# Patient Record
Sex: Male | Born: 1965
Health system: Southern US, Community
[De-identification: ages and names within clinical notes are randomized; demographics above are authoritative.]

## PROBLEM LIST (undated history)

## (undated) DIAGNOSIS — F419 Anxiety disorder, unspecified: Secondary | ICD-10-CM

## (undated) DIAGNOSIS — T8859XA Other complications of anesthesia, initial encounter: Secondary | ICD-10-CM

## (undated) DIAGNOSIS — Z860100 Personal history of colon polyps, unspecified: Secondary | ICD-10-CM

## (undated) DIAGNOSIS — K648 Other hemorrhoids: Secondary | ICD-10-CM

## (undated) DIAGNOSIS — M549 Dorsalgia, unspecified: Secondary | ICD-10-CM

## (undated) DIAGNOSIS — K602 Anal fissure, unspecified: Secondary | ICD-10-CM

## (undated) DIAGNOSIS — K219 Gastro-esophageal reflux disease without esophagitis: Secondary | ICD-10-CM

## (undated) DIAGNOSIS — T4145XA Adverse effect of unspecified anesthetic, initial encounter: Secondary | ICD-10-CM

## (undated) DIAGNOSIS — E785 Hyperlipidemia, unspecified: Secondary | ICD-10-CM

## (undated) DIAGNOSIS — R1310 Dysphagia, unspecified: Secondary | ICD-10-CM

## (undated) DIAGNOSIS — A048 Other specified bacterial intestinal infections: Secondary | ICD-10-CM

## (undated) HISTORY — PX: APPENDECTOMY: SHX54

## (undated) HISTORY — DX: Other specified bacterial intestinal infections: A04.8

## (undated) HISTORY — DX: Anal fissure, unspecified: K60.2

## (undated) HISTORY — PX: COLONOSCOPY: SHX174

## (undated) HISTORY — DX: Dysphagia, unspecified: R13.10

## (undated) HISTORY — PX: UPPER GASTROINTESTINAL ENDOSCOPY: SHX188

## (undated) HISTORY — PX: POLYPECTOMY: SHX149

## (undated) HISTORY — PX: BACK SURGERY: SHX140

## (undated) HISTORY — DX: Personal history of colonic polyps: Z86.010

## (undated) HISTORY — DX: Personal history of colon polyps, unspecified: Z86.0100

## (undated) HISTORY — DX: Anxiety disorder, unspecified: F41.9

## (undated) HISTORY — PX: COLONOSCOPY: SHX5424

## (undated) HISTORY — DX: Other hemorrhoids: K64.8

## (undated) HISTORY — DX: Hyperlipidemia, unspecified: E78.5

---

## 2007-12-16 ENCOUNTER — Encounter (INDEPENDENT_AMBULATORY_CARE_PROVIDER_SITE_OTHER): Payer: Self-pay | Admitting: Surgery

## 2007-12-16 ENCOUNTER — Ambulatory Visit (HOSPITAL_COMMUNITY): Admission: RE | Admit: 2007-12-16 | Discharge: 2007-12-17 | Payer: Self-pay | Admitting: Surgery

## 2007-12-16 ENCOUNTER — Ambulatory Visit (HOSPITAL_COMMUNITY): Admission: RE | Admit: 2007-12-16 | Discharge: 2007-12-16 | Payer: Self-pay | Admitting: Family Medicine

## 2011-01-16 NOTE — H&P (Signed)
Aaron Simon, Aaron Simon                ACCOUNT NO.:  [id number]   MEDICAL RECORD NO.:  [id number]         PATIENT TYPE:  AMB   LOCATION:  DAY                          FACILITY:  [facility]   PHYSICIAN:  Currie Paris, M.D.DATE OF BIRTH:  12/10/1966   DATE OF ADMISSION:  12/16/2007  DATE OF DISCHARGE:                              HISTORY & PHYSICAL   CHIEF COMPLAINT:  Right lower quadrant pain.   HISTORY OF PRESENT ILLNESS:  Mr. Iwanicki is a 45 year old gentleman who  has had about a 2-day history of abdominal pain which is localized into  the right lower quadrant. He had a little nausea and anorexia yesterday  but really has not eaten since yesterday either. He has had no  significant nausea or vomiting. No fevers or chills, and he has not had  a bowel movement. His pain got a little bit better after he got up this  morning but was bothering him pretty much during the night. He saw Dr.  Penni Bombard and was suspected to have appendicitis and was sent to [facility] to  obtain labs and x-rays including a CT scan.   The patient is otherwise considered in general good health.   PAST MEDICAL HISTORY AND OPERATIONS:  None.   MEDICATIONS:  None.   ALLERGIES:  None known.   SOCIAL HISTORY:  Smokes none. Alcohol none. 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   FAMILY HISTORY:  His mother has had a brain tumor. Other than that  negative.   REVIEW OF SYSTEMS:  HEENT:  Negative. CHEST:  No cough or shortness of  breath. HEART:  No history of murmurs or cardiac issues. ABDOMEN:  Negative except for HPI. GU:  Negative. EXTREMITIES:  Negative.   PHYSICAL EXAMINATION:  GENERAL:  The patient is alert, awake, oriented.  Generally healthy appearing.  VITAL SIGNS:  Noted in the chart.  HEAD:  Normocephalic.  EYES:  Nonicteric. Pupils equal, round and regular.  NECK:  Supple. No masses or thyromegaly.  LUNGS:  Normal  respirations. Clear to auscultation.  HEART:  Regular rhythm. No murmurs, rubs, or gallops.  ABDOMEN:  Basically soft, but he is distinctly tender in the right lower  quadrant with some very early rebound. There is minimal guarding there.  The rest of the abdomen is completely soft and completely benign. Bowel  sounds are present.  EXTREMITIES:  Good range of motion. No edema noted. No deformities  noted.   DATA REVIEWED:  White count is only 4500. CT scan is consistent with an  early appendicitis.   IMPRESSION:  Evolving appendicitis.   RECOMMENDATIONS:  Given his CT scan, I think proceeding with  appendectomy is the appropriate plan. I have discussed the indications,  risks, and complications with the patient and his wife. I think they are  all in agreement. We will try to do this this afternoon at some point in  time.      Currie Paris, M.D.  Electronically Signed     CJS/MEDQ  D:  12/16/2007  T:  12/16/2007  Job:  [id number]

## 2011-01-16 NOTE — Op Note (Signed)
NAMEMARLYN, Simon                ACCOUNT NO.:  [id number]   MEDICAL RECORD NO.:  [id number]          PATIENT TYPE:  AMB   LOCATION:  DAY                          FACILITY:  [facility]   PHYSICIAN:  Currie Paris, M.D.DATE OF BIRTH:  Feb 11, 1966   DATE OF PROCEDURE:  12/16/2007  DATE OF DISCHARGE:                               OPERATIVE REPORT   PREOPERATIVE DIAGNOSIS:  Acute appendicitis.   POSTOPERATIVE DIAGNOSIS:  Acute appendicitis.   OPERATION:  Laparoscopic appendectomy.   SURGEON:  Currie Paris, M.D.   ANESTHESIA:  General.   CLINICAL HISTORY:  This is a 45 year old gentleman with about a 36 hour  history of abdominal pain localized into the right lower quadrant with  mild right lower quadrant tenderness and early rebound on physical  examination.  His white count was normal, but his CT scan was consistent  with an early appendicitis.  It was decided to proceed to appendectomy.   DESCRIPTION OF PROCEDURE:  The patient was seen in the holding area.  We  reviewed the plans for surgery, and he had no further questions.   He was taken to the operating room and after satisfactory general  anesthesia had been obtained, a Foley catheter was placed, and the  abdomen clipped, prepped and draped.  The time-out was done.  I used  0.25% plain Marcaine for each incision.  I made an umbilical incision,  opened the fascia, and entered the peritoneal cavity under direct  vision.  A purse-string was placed, and a Hasson introduced.  The  abdomen was insufflated to 15.  The appendix was noted almost  immediately to be turgid and pointing upright or anteriorly with edema  of the mesentery, consistent with an early appendicitis.   A 5 mm trocar is placed under direct vision in the right upper quadrant,  and another 5 mm trocar placed under direct vision in the left lower  quadrant.  The camera was placed in the left lower quadrant port.  The  appendix was grasped and elevated so  that I could see the mesentery, and  the mesentery was divided down to the base with a harmonic scalpel, and  this went well with no evidence of bleeding.  The endo-GIA was placed  through the umbilical port at the base of the appendix and closed.  I  waited 30 seconds and then fired it, disconnecting the appendix.  A few  stray staples were collected.   The appendix was placed in a bag and brought out the umbilical port.  The Hasson was reintroduced, and we did a final insufflation check for  hemostasis.  We looked at the staple wound, and everything looked okay.  There were no other gross abnormalities noted.   The two 5 mm ports were removed under direct vision.  There was no  bleeding.  The umbilical port was removed, and the abdomen deflated  through that site.  A pursestring was used to close the fascia.  The  skin was closed with 4-0 Monocryl, subcuticular, and Dermabond.   The patient tolerated the procedure well, and there  were no operative  complications.  All counts were correct.      Currie Paris, M.D.  Electronically Signed     CJS/MEDQ  D:  12/16/2007  T:  12/16/2007  Job:  [id number]   cc:   Quita Skye. Artis Flock, M.D.  Fax: [phone]

## 2012-04-07 ENCOUNTER — Other Ambulatory Visit: Payer: Self-pay | Admitting: Orthopedic Surgery

## 2012-04-08 ENCOUNTER — Encounter (HOSPITAL_COMMUNITY): Payer: Self-pay | Admitting: Pharmacy Technician

## 2012-04-08 ENCOUNTER — Other Ambulatory Visit: Payer: Self-pay | Admitting: Orthopedic Surgery

## 2012-04-14 ENCOUNTER — Encounter (HOSPITAL_COMMUNITY)
Admission: RE | Admit: 2012-04-14 | Discharge: 2012-04-14 | Disposition: A | Discharge status: Home / Self Care | Payer: BC Managed Care – PPO | Source: Ambulatory Visit | Attending: Orthopedic Surgery | Admitting: Orthopedic Surgery

## 2012-04-14 ENCOUNTER — Encounter (HOSPITAL_COMMUNITY): Payer: Self-pay

## 2012-04-14 HISTORY — DX: Dorsalgia, unspecified: M54.9

## 2012-04-14 LAB — CBC WITH DIFFERENTIAL/PLATELET
Basophils Relative: 0 % (ref 0–1)
HCT: 44.9 % (ref 39.0–52.0)
Hemoglobin: 15.8 g/dL (ref 13.0–17.0)
Lymphocytes Relative: 41 % (ref 12–46)
Monocytes Absolute: 0.5 10*3/uL (ref 0.1–1.0)
Monocytes Relative: 7 % (ref 3–12)
Neutro Abs: 3.4 10*3/uL (ref 1.7–7.7)
Neutrophils Relative %: 51 % (ref 43–77)
RBC: 5 MIL/uL (ref 4.22–5.81)
WBC: 6.7 10*3/uL (ref 4.0–10.5)

## 2012-04-14 LAB — URINALYSIS, ROUTINE W REFLEX MICROSCOPIC
Glucose, UA: NEGATIVE mg/dL
Leukocytes, UA: NEGATIVE
Nitrite: NEGATIVE
Specific Gravity, Urine: 1.015 (ref 1.005–1.030)
pH: 7 (ref 5.0–8.0)

## 2012-04-14 LAB — COMPREHENSIVE METABOLIC PANEL
AST: 24 U/L (ref 0–37)
Albumin: 4.1 g/dL (ref 3.5–5.2)
Alkaline Phosphatase: 42 U/L (ref 39–117)
BUN: 14 mg/dL (ref 6–23)
CO2: 29 mEq/L (ref 19–32)
Chloride: 102 mEq/L (ref 96–112)
Creatinine, Ser: 0.89 mg/dL (ref 0.50–1.35)
GFR calc non Af Amer: >90 mL/min (ref >90)
Potassium: 3.9 mEq/L (ref 3.5–5.1)
Total Bilirubin: 0.9 mg/dL (ref 0.3–1.2)

## 2012-04-14 LAB — PROTIME-INR: INR: 1.06 (ref 0.00–1.49)

## 2012-04-14 LAB — TYPE AND SCREEN

## 2012-04-14 LAB — APTT: aPTT: 28 seconds (ref 24–37)

## 2012-04-14 LAB — ABO/RH: ABO/RH(D): O NEG

## 2012-04-14 NOTE — Progress Notes (Signed)
Pt had EKG tracings from PCP, however no interpretation. Requested from office with interpretation - [employer] [employer] in [place].   Pt also brought results of PCR swab from 03/26/12, done at [facility], negative for staph and MRSA.

## 2012-04-14 NOTE — Pre-Procedure Instructions (Signed)
20 DEMETRIES COIA  04/14/2012    Your procedure is scheduled on:  Thursday, August 15  Report to [facility] at 8:30 AM.  Call this number if you have problems the morning of surgery: [phone]   Remember:   Do not eat food:After Midnight.  May have clear liquids:until Midnight .  Clear liquids include soda, tea, black coffee, apple or grape juice, broth.  Take these medicines the morning of surgery with A SIP OF WATER: none   Do not wear jewelry, make-up or nail polish.  Do not wear lotions, powders, or perfumes. You may wear deodorant.  Do not shave 48 hours prior to surgery. Men may shave face and neck.  Do not bring valuables to the hospital.  Contacts, dentures or bridgework may not be worn into surgery.  Leave suitcase in the car. After surgery it may be brought to your room.  For patients admitted to the hospital, checkout time is 11:00 AM the day of discharge.   Patients discharged the day of surgery will not be allowed to drive home.  Name and phone number of your driver: Sherlon Handing, wife  Special Instructions: Incentive Spirometry - Practice and bring it with you on the day of surgery. and CHG Shower Use Special Wash: 1/2 bottle night before surgery and 1/2 bottle morning of surgery.   Please read over the following fact sheets that you were given: Pain Booklet, Coughing and Deep Breathing, Blood Transfusion Information and Surgical Site Infection Prevention

## 2012-04-15 NOTE — Consult Note (Addendum)
Anesthesia Chart Review:  Patient is a 46 year old male scheduled for right sided L5-S1 microdiskectomy on 04/17/12 by Dr. Yevette Edwards.  History includes non-smoker, back pain, appendectomy.  Per PAT RN notes, he receives primary care at [facility] in [place], however; he had a pre-operative evaluation by Dr. Orvan Falconer at [facility]  (I believe his wife is a Secondary school teacher at [employer].)  He had a normal CXR on 04/14/12.  Labs on 04/14/12 are WNL.    EKG from 03/28/12 (PCP) showed NSR with low voltage QRS in aVL.  Apparently, his initial EKG on 03/28/12 from [facility] was thought to have an aflutter pattern versus artifact (sinus arrhythmia with incomplete right BBB, non-specific T wave changes), and patient was referred to [facility] in [place] and saw Dr. Josiah Lobo. He reassured the patient about previous EKG findings, but did order an echo that was done on 04/15/12 that showed preserved LV systolic function, impaired relaxation, mild MR/TR.  By Dr. Crisoforo Oxford notes, if patients echo didn't show any significant findings he was not felt to be high risk for surgery.  Anticipate he can proceed as planned.  Shonna Chock, PA-C

## 2012-04-16 MED ORDER — CEFAZOLIN SODIUM-DEXTROSE 2-3 GM-% IV SOLR
2.0000 g | INTRAVENOUS | Status: AC
  Administered 2012-04-17: 2 g via INTRAVENOUS
  Filled 2012-04-16: qty 50

## 2012-04-16 MED ORDER — CHLORHEXIDINE GLUCONATE 4 % EX LIQD: 60.0000 mL | Freq: Once | CUTANEOUS | Status: DC

## 2012-04-16 MED ORDER — POVIDONE-IODINE 7.5 % EX SOLN
Freq: Once | CUTANEOUS | Status: DC
  Filled 2012-04-16: qty 118

## 2012-04-17 ENCOUNTER — Ambulatory Visit (HOSPITAL_COMMUNITY)
Admission: RE | Admit: 2012-04-17 | Discharge: 2012-04-17 | Disposition: A | Discharge status: Home / Self Care | Payer: BC Managed Care – PPO | Source: Ambulatory Visit | Attending: Orthopedic Surgery | Admitting: Orthopedic Surgery

## 2012-04-17 ENCOUNTER — Ambulatory Visit (HOSPITAL_COMMUNITY): Payer: BC Managed Care – PPO | Admitting: Vascular Surgery

## 2012-04-17 ENCOUNTER — Encounter (HOSPITAL_COMMUNITY): Payer: Self-pay | Admitting: Vascular Surgery

## 2012-04-17 ENCOUNTER — Encounter (HOSPITAL_COMMUNITY)
Admission: RE | Disposition: A | Discharge status: Home / Self Care | Payer: Self-pay | Source: Ambulatory Visit | Attending: Orthopedic Surgery

## 2012-04-17 ENCOUNTER — Ambulatory Visit (HOSPITAL_COMMUNITY): Payer: BC Managed Care – PPO

## 2012-04-17 DIAGNOSIS — M5416 Radiculopathy, lumbar region: Secondary | ICD-10-CM

## 2012-04-17 DIAGNOSIS — Z01811 Encounter for preprocedural respiratory examination: Secondary | ICD-10-CM | POA: Insufficient documentation

## 2012-04-17 DIAGNOSIS — Z01818 Encounter for other preprocedural examination: Secondary | ICD-10-CM | POA: Insufficient documentation

## 2012-04-17 DIAGNOSIS — Z01812 Encounter for preprocedural laboratory examination: Secondary | ICD-10-CM | POA: Insufficient documentation

## 2012-04-17 DIAGNOSIS — M5126 Other intervertebral disc displacement, lumbar region: Secondary | ICD-10-CM | POA: Insufficient documentation

## 2012-04-17 HISTORY — PX: LUMBAR LAMINECTOMY/DECOMPRESSION MICRODISCECTOMY: SHX5026

## 2012-04-17 SURGERY — LUMBAR LAMINECTOMY/DECOMPRESSION MICRODISCECTOMY
Anesthesia: General | Site: Back | Laterality: Right | Wound class: Clean

## 2012-04-17 MED ORDER — HEMOSTATIC AGENTS (NO CHARGE) OPTIME
TOPICAL | Status: DC | PRN
  Administered 2012-04-17: 1 via TOPICAL

## 2012-04-17 MED ORDER — BUPIVACAINE-EPINEPHRINE PF 0.25-1:200000 % IJ SOLN
INTRAMUSCULAR | Status: AC
  Filled 2012-04-17: qty 30

## 2012-04-17 MED ORDER — ROCURONIUM BROMIDE 100 MG/10ML IV SOLN
INTRAVENOUS | Status: DC | PRN
  Administered 2012-04-17: 50 mg via INTRAVENOUS

## 2012-04-17 MED ORDER — MIDAZOLAM HCL 5 MG/5ML IJ SOLN
INTRAMUSCULAR | Status: DC | PRN
  Administered 2012-04-17 (×2): 1 mg via INTRAVENOUS

## 2012-04-17 MED ORDER — THROMBIN 20000 UNITS EX SOLR
CUTANEOUS | Status: DC | PRN
  Administered 2012-04-17: 11:00:00 via TOPICAL

## 2012-04-17 MED ORDER — PROPOFOL 10 MG/ML IV EMUL
INTRAVENOUS | Status: DC | PRN
  Administered 2012-04-17: 160 mg via INTRAVENOUS

## 2012-04-17 MED ORDER — GLYCOPYRROLATE 0.2 MG/ML IJ SOLN
INTRAMUSCULAR | Status: DC | PRN
  Administered 2012-04-17: .5 mg via INTRAVENOUS

## 2012-04-17 MED ORDER — METHYLPREDNISOLONE ACETATE 40 MG/ML IJ SUSP
INTRAMUSCULAR | Status: DC | PRN
  Administered 2012-04-17: .5 mL

## 2012-04-17 MED ORDER — LIDOCAINE HCL 4 % MT SOLN
OROMUCOSAL | Status: DC | PRN
  Administered 2012-04-17: 4 mL via TOPICAL

## 2012-04-17 MED ORDER — BUPIVACAINE-EPINEPHRINE 0.25% -1:200000 IJ SOLN
INTRAMUSCULAR | Status: DC | PRN
  Administered 2012-04-17: 1 mL

## 2012-04-17 MED ORDER — LIDOCAINE HCL (CARDIAC) 20 MG/ML IV SOLN
INTRAVENOUS | Status: DC | PRN
  Administered 2012-04-17: 100 mg via INTRAVENOUS

## 2012-04-17 MED ORDER — PROMETHAZINE HCL 25 MG/ML IJ SOLN: 6.2500 mg | INTRAMUSCULAR | Status: DC | PRN

## 2012-04-17 MED ORDER — FENTANYL CITRATE 0.05 MG/ML IJ SOLN: 50.0000 ug | INTRAMUSCULAR | Status: DC | PRN

## 2012-04-17 MED ORDER — INDIGOTINDISULFONATE SODIUM 8 MG/ML IJ SOLN
INTRAMUSCULAR | Status: DC | PRN
  Administered 2012-04-17: .3 mL

## 2012-04-17 MED ORDER — NEOSTIGMINE METHYLSULFATE 1 MG/ML IJ SOLN
INTRAMUSCULAR | Status: DC | PRN
  Administered 2012-04-17: 3 mg via INTRAVENOUS

## 2012-04-17 MED ORDER — THROMBIN 20000 UNITS EX SOLR
CUTANEOUS | Status: AC
  Filled 2012-04-17: qty 20000

## 2012-04-17 MED ORDER — HYDROMORPHONE HCL PF 1 MG/ML IJ SOLN
0.2500 mg | INTRAMUSCULAR | Status: DC | PRN
  Administered 2012-04-17: 0.5 mg via INTRAVENOUS

## 2012-04-17 MED ORDER — FENTANYL CITRATE 0.05 MG/ML IJ SOLN
INTRAMUSCULAR | Status: DC | PRN
  Administered 2012-04-17: 50 ug via INTRAVENOUS
  Administered 2012-04-17: 100 ug via INTRAVENOUS

## 2012-04-17 MED ORDER — HYDROMORPHONE HCL PF 1 MG/ML IJ SOLN
INTRAMUSCULAR | Status: AC
  Filled 2012-04-17: qty 1

## 2012-04-17 MED ORDER — LACTATED RINGERS IV SOLN
INTRAVENOUS | Status: DC
  Administered 2012-04-17: 10:00:00 via INTRAVENOUS

## 2012-04-17 MED ORDER — ONDANSETRON HCL 4 MG/2ML IJ SOLN
INTRAMUSCULAR | Status: DC | PRN
  Administered 2012-04-17: 4 mg via INTRAVENOUS

## 2012-04-17 MED ORDER — METHYLPREDNISOLONE ACETATE 80 MG/ML IJ SUSP
INTRAMUSCULAR | Status: AC
  Filled 2012-04-17: qty 1

## 2012-04-17 MED ORDER — LACTATED RINGERS IV SOLN
INTRAVENOUS | Status: DC | PRN
  Administered 2012-04-17 (×2): via INTRAVENOUS

## 2012-04-17 MED ORDER — INDIGOTINDISULFONATE SODIUM 8 MG/ML IJ SOLN
INTRAMUSCULAR | Status: AC
  Filled 2012-04-17: qty 5

## 2012-04-17 MED ORDER — MIDAZOLAM HCL 2 MG/2ML IJ SOLN: 1.0000 mg | INTRAMUSCULAR | Status: DC | PRN

## 2012-04-17 SURGICAL SUPPLY — 60 items
BENZOIN TINCTURE PRP APPL 2/3 (GAUZE/BANDAGES/DRESSINGS) ×4 IMPLANT
BUR ROUND PRECISION 4.0 (BURR) ×2 IMPLANT
CANISTER SUCTION 2500CC (MISCELLANEOUS) ×2 IMPLANT
CLOTH BEACON ORANGE TIMEOUT ST (SAFETY) ×2 IMPLANT
CLSR STERI-STRIP ANTIMIC 1/2X4 (GAUZE/BANDAGES/DRESSINGS) ×2 IMPLANT
CONT SPEC STER OR (MISCELLANEOUS) ×2 IMPLANT
CORDS BIPOLAR (ELECTRODE) ×2 IMPLANT
COVER SURGICAL LIGHT HANDLE (MISCELLANEOUS) ×2 IMPLANT
DRAIN CHANNEL 10F 3/8 F FF (DRAIN) IMPLANT
DRAPE POUCH INSTRU U-SHP 10X18 (DRAPES) ×4 IMPLANT
DRAPE SURG 17X23 STRL (DRAPES) ×8 IMPLANT
DURAPREP 26ML APPLICATOR (WOUND CARE) ×2 IMPLANT
ELECT BLADE 4.0 EZ CLEAN MEGAD (MISCELLANEOUS)
ELECT BLADE 6.5 EXT (BLADE) IMPLANT
ELECT CAUTERY BLADE 6.4 (BLADE) ×2 IMPLANT
ELECT REM PT RETURN 9FT ADLT (ELECTROSURGICAL) ×2
ELECTRODE BLDE 4.0 EZ CLN MEGD (MISCELLANEOUS) IMPLANT
ELECTRODE REM PT RTRN 9FT ADLT (ELECTROSURGICAL) ×1 IMPLANT
EVACUATOR SILICONE 100CC (DRAIN) IMPLANT
FILTER STRAW FLUID ASPIR (MISCELLANEOUS) ×2 IMPLANT
GAUZE SPONGE 4X4 16PLY XRAY LF (GAUZE/BANDAGES/DRESSINGS) ×4 IMPLANT
GLOVE BIO SURGEON STRL SZ8 (GLOVE) ×2 IMPLANT
GLOVE BIOGEL PI IND STRL 8 (GLOVE) ×1 IMPLANT
GLOVE BIOGEL PI INDICATOR 8 (GLOVE) ×1
GOWN BRE IMP SLV AUR XL STRL (GOWN DISPOSABLE) ×2 IMPLANT
GOWN STRL NON-REIN LRG LVL3 (GOWN DISPOSABLE) IMPLANT
GOWN STRL REIN XL XLG (GOWN DISPOSABLE) ×6 IMPLANT
IV CATH 14GX2 1/4 (CATHETERS) ×2 IMPLANT
KIT BASIN OR (CUSTOM PROCEDURE TRAY) ×2 IMPLANT
KIT ROOM TURNOVER OR (KITS) ×2 IMPLANT
NEEDLE 18GX1X1/2 (RX/OR ONLY) (NEEDLE) ×2 IMPLANT
NEEDLE HYPO 25GX1X1/2 BEV (NEEDLE) ×2 IMPLANT
NEEDLE SPNL 18GX3.5 QUINCKE PK (NEEDLE) ×4 IMPLANT
NS IRRIG 1000ML POUR BTL (IV SOLUTION) ×2 IMPLANT
PACK LAMINECTOMY ORTHO (CUSTOM PROCEDURE TRAY) ×2 IMPLANT
PACK UNIVERSAL I (CUSTOM PROCEDURE TRAY) ×2 IMPLANT
PAD ARMBOARD 7.5X6 YLW CONV (MISCELLANEOUS) ×4 IMPLANT
PATTIES SURGICAL .5 X.5 (GAUZE/BANDAGES/DRESSINGS) ×2 IMPLANT
PATTIES SURGICAL .5 X1 (DISPOSABLE) ×2 IMPLANT
PATTIES SURGICAL 1X1 (DISPOSABLE) IMPLANT
SPONGE GAUZE 4X4 12PLY (GAUZE/BANDAGES/DRESSINGS) ×2 IMPLANT
STRIP CLOSURE SKIN 1/2X4 (GAUZE/BANDAGES/DRESSINGS) ×2 IMPLANT
SURGIFLO TRUKIT (HEMOSTASIS) IMPLANT
SUT ETHILON 3 0 FSL (SUTURE) IMPLANT
SUT VIC AB 0 CT1 27 (SUTURE)
SUT VIC AB 0 CT1 27XBRD ANBCTR (SUTURE) IMPLANT
SUT VIC AB 0 CT2 27 (SUTURE) ×2 IMPLANT
SUT VIC AB 1 CT1 18XCR BRD 8 (SUTURE) ×1 IMPLANT
SUT VIC AB 1 CT1 8-18 (SUTURE) ×1
SUT VIC AB 2-0 CT2 18 VCP726D (SUTURE) ×2 IMPLANT
SYR 20CC LL (SYRINGE) IMPLANT
SYR BULB IRRIGATION 50ML (SYRINGE) ×2 IMPLANT
SYR CONTROL 10ML LL (SYRINGE) ×2 IMPLANT
SYR TB 1ML 26GX3/8 SAFETY (SYRINGE) ×4 IMPLANT
SYR TB 1ML LUER SLIP (SYRINGE) ×4 IMPLANT
TAPE CLOTH SURG 4X10 WHT LF (GAUZE/BANDAGES/DRESSINGS) ×2 IMPLANT
TOWEL OR 17X24 6PK STRL BLUE (TOWEL DISPOSABLE) ×2 IMPLANT
TOWEL OR 17X26 10 PK STRL BLUE (TOWEL DISPOSABLE) ×2 IMPLANT
WATER STERILE IRR 1000ML POUR (IV SOLUTION) ×2 IMPLANT
YANKAUER SUCT BULB TIP NO VENT (SUCTIONS) ×2 IMPLANT

## 2012-04-17 NOTE — Transfer of Care (Signed)
Immediate Anesthesia Transfer of Care Note  Patient: Aaron Simon  Procedure(s) Performed: Procedure(s) (LRB): LUMBAR LAMINECTOMY/DECOMPRESSION MICRODISCECTOMY (Right)  Patient Location: PACU  Anesthesia Type: General  Level of Consciousness: awake, alert  and oriented  Airway & Oxygen Therapy: Patient Spontanous Breathing and Patient connected to nasal cannula oxygen  Post-op Assessment: Report given to PACU RN, Post -op Vital signs reviewed and stable and Patient moving all extremities X 4  Post vital signs: Reviewed and stable  Complications: No apparent anesthesia complications

## 2012-04-17 NOTE — Preoperative (Signed)
Beta Blockers   Reason not to administer Beta Blockers:Not Applicable 

## 2012-04-17 NOTE — Anesthesia Preprocedure Evaluation (Signed)
Anesthesia Evaluation  Patient identified by MRN, date of birth, ID band Patient awake    Reviewed: Allergy & Precautions, H&P , NPO status , Patient's Chart, lab work & pertinent test results  Airway Mallampati: I TM Distance: >3 FB Neck ROM: Full    Dental   Pulmonary    Pulmonary exam normal       Cardiovascular     Neuro/Psych    GI/Hepatic   Endo/Other    Renal/GU      Musculoskeletal   Abdominal   Peds  Hematology   Anesthesia Other Findings   Reproductive/Obstetrics                           Anesthesia Physical Anesthesia Plan  ASA: I  Anesthesia Plan: General   Post-op Pain Management:    Induction: Intravenous  Airway Management Planned: Oral ETT  Additional Equipment:   Intra-op Plan:   Post-operative Plan: Extubation in OR  Informed Consent: I have reviewed the patients History and Physical, chart, labs and discussed the procedure including the risks, benefits and alternatives for the proposed anesthesia with the patient or authorized representative who has indicated his/her understanding and acceptance.     Plan Discussed with: CRNA and Surgeon  Anesthesia Plan Comments:         Anesthesia Quick Evaluation

## 2012-04-17 NOTE — Progress Notes (Signed)
Report given to phillip rn as caregiver 

## 2012-04-17 NOTE — Anesthesia Procedure Notes (Signed)
Procedure Name: Intubation Date/Time: 04/17/2012 10:25 AM Performed by: Margaree Mackintosh Pre-anesthesia Checklist: Patient identified, Timeout performed, Emergency Drugs available, Suction available and Patient being monitored Patient Re-evaluated:Patient Re-evaluated prior to inductionOxygen Delivery Method: Circle system utilized Preoxygenation: Pre-oxygenation with 100% oxygen Intubation Type: IV induction Ventilation: Mask ventilation without difficulty Laryngoscope Size: Mac and 3 Grade View: Grade I Tube type: Oral Tube size: 8.0 mm Number of attempts: 1 Airway Equipment and Method: Stylet and LTA kit utilized Placement Confirmation: ETT inserted through vocal cords under direct vision,  positive ETCO2 and breath sounds checked- equal and bilateral Secured at: 21 cm Tube secured with: Tape Dental Injury: Teeth and Oropharynx as per pre-operative assessment

## 2012-04-17 NOTE — Anesthesia Postprocedure Evaluation (Signed)
  Anesthesia Post-op Note  Patient: Aaron Simon  Procedure(s) Performed: Procedure(s) (LRB): LUMBAR LAMINECTOMY/DECOMPRESSION MICRODISCECTOMY (Right)  Patient Location: PACU  Anesthesia Type: General  Level of Consciousness: awake  Airway and Oxygen Therapy: Patient Spontanous Breathing  Post-op Pain: mild  Post-op Assessment: Post-op Vital signs reviewed, Patient's Cardiovascular Status Stable, Respiratory Function Stable, Patent Airway, No signs of Nausea or vomiting and Pain level controlled  Post-op Vital Signs: stable  Complications: No apparent anesthesia complications

## 2012-04-17 NOTE — H&P (Signed)
PREOPERATIVE H&P  Chief Complaint: right leg pain  HPI: LESS WOOLSEY is a 46 y.o. male who presents with right leg pain  Past Medical History  Diagnosis Date  . Back pain    Past Surgical History  Procedure Date  . Appendectomy    History   Social History  . Marital Status: Married    Spouse Name: N/A    Number of Children: N/A  . Years of Education: N/A   Social History Main Topics  . Smoking status: Never Smoker   . Smokeless tobacco: Not on file  . Alcohol Use: No  . Drug Use: No  . Sexually Active:    Other Topics Concern  . Not on file   Social History Narrative  . No narrative on file   No family history on file. No Known Allergies Prior to Admission medications   Medication Sig Start Date End Date Taking? Authorizing Provider  Naproxen-Esomeprazole (VIMOVO) 500-20 MG TBEC Take 1 tablet by mouth 2 times daily at 12 noon and 4 pm. Back pain On hold since April 06, 2012 until after procedure    Historical Provider, MD     All other systems have been reviewed and were otherwise negative with the exception of those mentioned in the HPI and as above.  Physical Exam: There were no vitals filed for this visit.  General: Alert, no acute distress Cardiovascular: No pedal edema Respiratory: No cyanosis, no use of accessory musculature Skin: No lesions in the area of chief complaint Neurologic: Sensation intact distally Psychiatric: Patient is competent for consent with normal mood and affect Lymphatic: No axillary or cervical lymphadenopathy    Assessment/Plan: Right leg pain Plan for Procedure(s): LUMBAR LAMINECTOMY/DECOMPRESSION MICRODISCECTOMY   Emilee Hero, MD 04/17/2012 6:53 AM

## 2012-04-18 ENCOUNTER — Encounter (HOSPITAL_COMMUNITY): Payer: Self-pay | Admitting: Orthopedic Surgery

## 2012-04-18 NOTE — Op Note (Signed)
Aaron Simon, Aaron Simon                ACCOUNT NO.:  [id number]  MEDICAL RECORD NO.:  [id number]  LOCATION:  [facility]                         FACILITY:  [facility]  PHYSICIAN:  Estill Bamberg, MD      DATE OF BIRTH:  Sep 01, 1966  DATE OF PROCEDURE:  04/17/2012 DATE OF DISCHARGE:  04/17/2012                              OPERATIVE REPORT   PREOPERATIVE DIAGNOSIS: 1. Right-sided S1 radiculopathy. 2. 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  POSTOPERATIVE DIAGNOSIS: 1. Right-sided S1 radiculopathy. 2. 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  SURGEON:  Estill Bamberg, MD  ASSISTANT:  Janace Litten, OPA  ANESTHESIA:  General endotracheal anesthesia.  COMPLICATIONS:  None disposition Stable.  ESTIMATED BLOOD LOSS:  Minimal.  INDICATIONS FOR PROCEDURE:  Briefly, Aaron Simon is an extremely pleasant 46 year old male, who presented to me with severe pain in the right leg on April 05, 2012.  At that point in time, the patient had been diagnosed with right-sided S1 radiculopathy and did have extensive forms of conservative care, including both oral and injectable steroids as well as various medications.  He continued to have some severe debilitating pain in the right leg.  An MRI was notable for right-sided L5-S1 disk herniation.  Given his failure of conservative measures, the patient and myself did have a discussion regarding going forward with a right-sided L5-S1 microdiskectomy.  The patient fully understood the risks and limitations of the procedure as outlined in my preoperative note.  OPERATIVE DETAILS:  On April 17, 2012, the patient was brought to surgery and general endotracheal anesthesia was administered.  The patient was placed prone on a well-padded a flat Jackson bed with a Wilson frame.  Antibiotics were given and a time-out procedure was performed.  I then placed two 18-gauge spinal needles over approximately the L5-S1 interspace and a  lateral intraoperative radiograph did confirm the and did help me optimize location of my incision.  I then made a 1.5 cm incision over the midline overlying the L5-S1 interspace.  The fascia was incised in a curvilinear fashion and a self-retaining McCullough retractor was placed.  I then identified the lamina L5 and S1.  The ligamentum flavum was identified.  An additional intraoperative radiograph was obtained to confirm the appropriate operative level.  I then used a 4-mm high-speed bur to remove the medial and the inferior aspect of the L5 lamina.  I then took down the ligamentum flavum and a partial facetectomy was performed.  The traversing S1 nerve was readily identified.  The traversing S1 nerve was gently mobilized medially.  An obvious disk herniation was readily apparent anterior to the traversing S1 nerve.  I did use a Penfield 4 to sweep away the superficial layers overlying the disk herniation and one very large fragment to readily to clear itself.  This was removed using a pituitary.  I then continued to explore the epidural space.  There no additional disk protrusion encountered.  There was no compression of the exiting S1 nerve, I assume and the traversing S1 nerve at this point.  I then controlled all epidural bleeding using bipolar electrocautery in addition to surgery flow.  I then  copiously irrigated the wound with approximately 200 mL of normal saline.  I then infiltrated 40 mg of Depo-Medrol about the traversing S1 nerve.  The fascia was then closed using #1 Vicryl.  The subcutaneous layer was then closed using 2-0 Vicryl.  The skin was closed using 4-0 Monocryl.  All instrument counts were correct at the termination of the procedure.  Of note, Janace Litten was my assistant throughout the entirety of the surgery and aided in essential retraction and suctioning required throughout the surgery.     Estill Bamberg, MD     MD/MEDQ  D:  04/17/2012  T:   04/18/2012  Job:  [id number]  cc:   Junious Dresser, MD

## 2014-10-27 ENCOUNTER — Other Ambulatory Visit: Payer: Self-pay | Admitting: Orthopedic Surgery

## 2014-10-29 ENCOUNTER — Other Ambulatory Visit: Payer: Self-pay | Admitting: Orthopedic Surgery

## 2014-11-02 ENCOUNTER — Other Ambulatory Visit (HOSPITAL_COMMUNITY): Payer: Self-pay

## 2014-11-10 ENCOUNTER — Other Ambulatory Visit: Payer: Self-pay | Admitting: Orthopedic Surgery

## 2014-11-22 NOTE — Pre-Procedure Instructions (Signed)
Aaron Simon  11/22/2014   Your procedure is scheduled on: Thursday, March 31.  Report to [facility] Admitting at 5:30 AM   Call this number if you have problems the morning of surgery: [phone]                For any other questions, please call [phone], Monday - Friday 8 AM - 4 PM.   Remember:   Do not eat food or drink liquids after midnight Wednesday, March 30.   Take these medicines the morning of surgery with A SIP OF WATER: dexlansoprazole (DEXILANT).               May take traMADol Veatrice Bourbon) if needed.   Do not wear jewelry, make-up or nail polish.  Do not wear lotions, powders, or perfumes.   Men may shave face and neck.  Do not bring valuables to the hospital.               [facility] is not responsible  for any belongings or valuables.               Contacts, dentures or bridgework may not be worn into surgery.  Leave suitcase in the car. After surgery it may be brought to your room.  For patients admitted to the hospital, discharge time is determined by your treatment team.             Special Instructions: Review  [place] - Preparing For Surgery.   Please read over the following fact sheets that you were given: Pain Booklet, Coughing and Deep Breathing, Blood Transfusion Information and Surgical Site Infection Prevention and Incentive Spirometry

## 2014-11-23 ENCOUNTER — Encounter (HOSPITAL_COMMUNITY)
Admission: RE | Admit: 2014-11-23 | Discharge: 2014-11-23 | Disposition: A | Discharge status: Home / Self Care | Payer: BLUE CROSS/BLUE SHIELD | Source: Ambulatory Visit | Attending: Orthopedic Surgery | Admitting: Orthopedic Surgery

## 2014-11-23 ENCOUNTER — Ambulatory Visit (HOSPITAL_COMMUNITY)
Admission: RE | Admit: 2014-11-23 | Discharge: 2014-11-23 | Disposition: A | Discharge status: Home / Self Care | Payer: BLUE CROSS/BLUE SHIELD | Source: Ambulatory Visit | Attending: Orthopedic Surgery | Admitting: Orthopedic Surgery

## 2014-11-23 ENCOUNTER — Encounter (HOSPITAL_COMMUNITY): Payer: Self-pay

## 2014-11-23 DIAGNOSIS — Z01818 Encounter for other preprocedural examination: Secondary | ICD-10-CM | POA: Insufficient documentation

## 2014-11-23 DIAGNOSIS — M545 Low back pain: Secondary | ICD-10-CM | POA: Diagnosis not present

## 2014-11-23 HISTORY — DX: Other complications of anesthesia, initial encounter: T88.59XA

## 2014-11-23 HISTORY — DX: Adverse effect of unspecified anesthetic, initial encounter: T41.45XA

## 2014-11-23 HISTORY — DX: Gastro-esophageal reflux disease without esophagitis: K21.9

## 2014-11-23 LAB — COMPREHENSIVE METABOLIC PANEL
ALT: 38 U/L (ref 0–53)
AST: 26 U/L (ref 0–37)
Albumin: 4.1 g/dL (ref 3.5–5.2)
Alkaline Phosphatase: 47 U/L (ref 39–117)
Anion gap: 11 (ref 5–15)
BUN: 6 mg/dL (ref 6–23)
CHLORIDE: 101 mmol/L (ref 96–112)
CO2: 27 mmol/L (ref 19–32)
CREATININE: 1 mg/dL (ref 0.50–1.35)
Calcium: 9.7 mg/dL (ref 8.4–10.5)
GFR calc Af Amer: >90 mL/min (ref >90)
GFR, EST NON AFRICAN AMERICAN: 87 mL/min — AB (ref >90)
Glucose, Bld: 110 mg/dL — ABNORMAL HIGH (ref 70–99)
Potassium: 3.7 mmol/L (ref 3.5–5.1)
Sodium: 139 mmol/L (ref 135–145)
Total Bilirubin: 1 mg/dL (ref 0.3–1.2)
Total Protein: 6.7 g/dL (ref 6.0–8.3)

## 2014-11-23 LAB — CBC WITH DIFFERENTIAL/PLATELET
BASOS PCT: 0 % (ref 0–1)
Basophils Absolute: 0 10*3/uL (ref 0.0–0.1)
EOS ABS: 0 10*3/uL (ref 0.0–0.7)
EOS PCT: 1 % (ref 0–5)
HEMATOCRIT: 45.4 % (ref 39.0–52.0)
Hemoglobin: 15.9 g/dL (ref 13.0–17.0)
LYMPHS ABS: 2 10*3/uL (ref 0.7–4.0)
Lymphocytes Relative: 34 % (ref 12–46)
MCH: 31.5 pg (ref 26.0–34.0)
MCHC: 35 g/dL (ref 30.0–36.0)
MCV: 90.1 fL (ref 78.0–100.0)
MONOS PCT: 8 % (ref 3–12)
Monocytes Absolute: 0.5 10*3/uL (ref 0.1–1.0)
Neutro Abs: 3.3 10*3/uL (ref 1.7–7.7)
Neutrophils Relative %: 57 % (ref 43–77)
Platelets: 279 10*3/uL (ref 150–400)
RBC: 5.04 MIL/uL (ref 4.22–5.81)
RDW: 13.2 % (ref 11.5–15.5)
WBC: 5.8 10*3/uL (ref 4.0–10.5)

## 2014-11-23 LAB — TYPE AND SCREEN
ABO/RH(D): O NEG
ANTIBODY SCREEN: NEGATIVE

## 2014-11-23 LAB — URINALYSIS, ROUTINE W REFLEX MICROSCOPIC
Bilirubin Urine: NEGATIVE
GLUCOSE, UA: NEGATIVE mg/dL
HGB URINE DIPSTICK: NEGATIVE
Ketones, ur: NEGATIVE mg/dL
Leukocytes, UA: NEGATIVE
Nitrite: NEGATIVE
Protein, ur: NEGATIVE mg/dL
Specific Gravity, Urine: 1.006 (ref 1.005–1.030)
Urobilinogen, UA: 0.2 mg/dL (ref 0.0–1.0)
pH: 6.5 (ref 5.0–8.0)

## 2014-11-23 LAB — SURGICAL PCR SCREEN
MRSA, PCR: NEGATIVE
STAPHYLOCOCCUS AUREUS: NEGATIVE

## 2014-11-23 LAB — APTT: aPTT: 30 seconds (ref 24–37)

## 2014-11-23 LAB — PROTIME-INR
INR: 0.97 (ref 0.00–1.49)
Prothrombin Time: 13 seconds (ref 11.6–15.2)

## 2014-12-01 MED ORDER — CEFAZOLIN SODIUM-DEXTROSE 2-3 GM-% IV SOLR
2.0000 g | INTRAVENOUS | Status: AC
  Administered 2014-12-02 (×2): 2 g via INTRAVENOUS
  Filled 2014-12-01: qty 50

## 2014-12-01 MED ORDER — POVIDONE-IODINE 7.5 % EX SOLN
Freq: Once | CUTANEOUS | Status: DC
  Filled 2014-12-01: qty 118

## 2014-12-01 NOTE — H&P (Signed)
     PREOPERATIVE H&P  Chief Complaint: right leg pain  HPI: Aaron Simon is a 49 y.o. male who presents with ongoing pain in the right leg. Patient is s/p a previous L5/S1 decompression 2.5 years ago, and recently had a recurrence of left leg pain  MRI reveals a large L5/S1 HNP, compression the left S1 nerve  Patient has failed multiple forms of conservative care and continues to have pain (see office notes for additional details regarding the patient's full course of treatment)  Past Medical History  Diagnosis Date  . Back pain   . Complication of anesthesia     Mental "goofy".  Sats dropped in PACU. "Does not take much"  . GERD (gastroesophageal reflux disease)    Past Surgical History  Procedure Laterality Date  . Appendectomy    . Lumbar laminectomy/decompression microdiscectomy  04/17/2012    Procedure: LUMBAR LAMINECTOMY/DECOMPRESSION MICRODISCECTOMY;  Surgeon: Sinclair Ship, MD;  Location: [facility];  Service: Orthopedics;  Laterality: Right;  Right sided lumbar 5-sacrum 1 microdisectomy  . Back surgery    . Colonoscopy    . Upper gastrointestinal endoscopy     History   Social History  . Marital Status: Married    Spouse Name: N/A  . Number of Children: N/A  . Years of Education: N/A   Social History Main Topics  . Smoking status: Never Smoker   . Smokeless tobacco: Not on file  . Alcohol Use: No  . Drug Use: No  . Sexual Activity: Not on file   Other Topics Concern  . Not on file   Social History Narrative   No family history on file. No Known Allergies Prior to Admission medications   Medication Sig Start Date End Date Taking? Authorizing Provider  dexlansoprazole (DEXILANT) 60 MG capsule Take 60 mg by mouth daily.   Yes Historical Provider, MD  traMADol (ULTRAM) 50 MG tablet Take 50 mg by mouth 3 (three) times daily as needed.    Yes Historical Provider, MD     All other systems have been reviewed and were otherwise negative with the  exception of those mentioned in the HPI and as above.  Physical Exam: There were no vitals filed for this visit.  General: Alert, no acute distress Cardiovascular: No pedal edema Respiratory: No cyanosis, no use of accessory musculature Skin: No lesions in the area of chief complaint Neurologic: Sensation intact distally Psychiatric: Patient is competent for consent with normal mood and affect Lymphatic: No axillary or cervical lymphadenopathy  MUSCULOSKELETAL: + SLR on right  Assessment/Plan: Right leg pain Plan for Procedure(s): Right sided lumbar 5- sacrum 1 transforaminal lumbar interbody fusion with instrumentation and allograft  (LEVEL 1)   Sinclair Ship, MD 12/01/2014 7:36 PM

## 2014-12-02 ENCOUNTER — Encounter (HOSPITAL_COMMUNITY)
Admission: RE | Disposition: A | Discharge status: Home / Self Care | Payer: BLUE CROSS/BLUE SHIELD | Source: Ambulatory Visit | Attending: Orthopedic Surgery

## 2014-12-02 ENCOUNTER — Inpatient Hospital Stay (HOSPITAL_COMMUNITY): Payer: BLUE CROSS/BLUE SHIELD | Admitting: Anesthesiology

## 2014-12-02 ENCOUNTER — Encounter (HOSPITAL_COMMUNITY): Payer: Self-pay | Admitting: *Deleted

## 2014-12-02 ENCOUNTER — Inpatient Hospital Stay (HOSPITAL_COMMUNITY): Payer: BLUE CROSS/BLUE SHIELD

## 2014-12-02 ENCOUNTER — Other Ambulatory Visit: Payer: Self-pay

## 2014-12-02 ENCOUNTER — Inpatient Hospital Stay (HOSPITAL_COMMUNITY)
Admission: RE | Admit: 2014-12-02 | Discharge: 2014-12-03 | DRG: 460 | Disposition: A | Discharge status: Home / Self Care | Payer: BLUE CROSS/BLUE SHIELD | Source: Ambulatory Visit | Attending: Orthopedic Surgery | Admitting: Orthopedic Surgery

## 2014-12-02 DIAGNOSIS — M5127 Other intervertebral disc displacement, lumbosacral region: Secondary | ICD-10-CM | POA: Diagnosis present

## 2014-12-02 DIAGNOSIS — Z419 Encounter for procedure for purposes other than remedying health state, unspecified: Secondary | ICD-10-CM

## 2014-12-02 DIAGNOSIS — M5417 Radiculopathy, lumbosacral region: Secondary | ICD-10-CM | POA: Diagnosis present

## 2014-12-02 DIAGNOSIS — K219 Gastro-esophageal reflux disease without esophagitis: Secondary | ICD-10-CM | POA: Diagnosis present

## 2014-12-02 DIAGNOSIS — M79604 Pain in right leg: Secondary | ICD-10-CM | POA: Diagnosis present

## 2014-12-02 DIAGNOSIS — M5126 Other intervertebral disc displacement, lumbar region: Secondary | ICD-10-CM | POA: Diagnosis present

## 2014-12-02 HISTORY — PX: LUMBAR FUSION: SHX111

## 2014-12-02 SURGERY — POSTERIOR LUMBAR FUSION 1 LEVEL
Anesthesia: General | Site: Back | Laterality: Right

## 2014-12-02 MED ORDER — BUPIVACAINE-EPINEPHRINE (PF) 0.25% -1:200000 IJ SOLN
INTRAMUSCULAR | Status: AC
  Filled 2014-12-02: qty 30

## 2014-12-02 MED ORDER — LACTATED RINGERS IV SOLN
INTRAVENOUS | Status: DC | PRN
  Administered 2014-12-02 (×3): via INTRAVENOUS

## 2014-12-02 MED ORDER — STERILE WATER FOR INJECTION IJ SOLN
INTRAMUSCULAR | Status: AC
  Filled 2014-12-02: qty 20

## 2014-12-02 MED ORDER — SODIUM CHLORIDE 0.9 % IJ SOLN: 3.0000 mL | INTRAMUSCULAR | Status: DC | PRN

## 2014-12-02 MED ORDER — GLYCOPYRROLATE 0.2 MG/ML IJ SOLN
INTRAMUSCULAR | Status: AC
  Filled 2014-12-02: qty 3

## 2014-12-02 MED ORDER — MORPHINE SULFATE 2 MG/ML IJ SOLN: 2.0000 mg | INTRAMUSCULAR | Status: DC | PRN

## 2014-12-02 MED ORDER — TRAMADOL HCL 50 MG PO TABS
50.0000 mg | ORAL_TABLET | ORAL | Status: DC | PRN
  Administered 2014-12-02 – 2014-12-03 (×5): 50 mg via ORAL
  Filled 2014-12-02 (×5): qty 1

## 2014-12-02 MED ORDER — METHYLENE BLUE 1 % INJ SOLN
INTRAMUSCULAR | Status: DC | PRN
  Administered 2014-12-02: 1 mL via INTRADERMAL

## 2014-12-02 MED ORDER — PROPOFOL 10 MG/ML IV BOLUS
INTRAVENOUS | Status: AC
  Filled 2014-12-02: qty 20

## 2014-12-02 MED ORDER — PHENOL 1.4 % MT LIQD: 1.0000 | OROMUCOSAL | Status: DC | PRN

## 2014-12-02 MED ORDER — ONDANSETRON HCL 4 MG/2ML IJ SOLN
INTRAMUSCULAR | Status: AC
  Filled 2014-12-02: qty 2

## 2014-12-02 MED ORDER — EPHEDRINE SULFATE 50 MG/ML IJ SOLN
INTRAMUSCULAR | Status: DC | PRN
  Administered 2014-12-02 (×3): 10 mg via INTRAVENOUS

## 2014-12-02 MED ORDER — ACETAMINOPHEN 650 MG RE SUPP
650.0000 mg | RECTAL | Status: DC | PRN
Start: 2014-12-02 — End: 2014-12-03

## 2014-12-02 MED ORDER — OXYCODONE HCL 5 MG PO TABS: 5.0000 mg | ORAL_TABLET | Freq: Once | ORAL | Status: DC | PRN

## 2014-12-02 MED ORDER — LIDOCAINE HCL (CARDIAC) 20 MG/ML IV SOLN
INTRAVENOUS | Status: AC
  Filled 2014-12-02: qty 10

## 2014-12-02 MED ORDER — FENTANYL CITRATE 0.05 MG/ML IJ SOLN
INTRAMUSCULAR | Status: AC
  Filled 2014-12-02: qty 5

## 2014-12-02 MED ORDER — LIDOCAINE HCL (CARDIAC) 20 MG/ML IV SOLN
INTRAVENOUS | Status: DC | PRN
  Administered 2014-12-02: 75 mg via INTRAVENOUS

## 2014-12-02 MED ORDER — EPHEDRINE SULFATE 50 MG/ML IJ SOLN
INTRAMUSCULAR | Status: AC
  Filled 2014-12-02: qty 1

## 2014-12-02 MED ORDER — ALUM & MAG HYDROXIDE-SIMETH 200-200-20 MG/5ML PO SUSP: 30.0000 mL | Freq: Four times a day (QID) | ORAL | Status: DC | PRN

## 2014-12-02 MED ORDER — SUCCINYLCHOLINE CHLORIDE 20 MG/ML IJ SOLN
INTRAMUSCULAR | Status: AC
  Filled 2014-12-02: qty 1

## 2014-12-02 MED ORDER — MENTHOL 3 MG MT LOZG: 1.0000 | LOZENGE | OROMUCOSAL | Status: DC | PRN

## 2014-12-02 MED ORDER — ACETAMINOPHEN 325 MG PO TABS
650.0000 mg | ORAL_TABLET | ORAL | Status: DC | PRN
  Administered 2014-12-02: 650 mg via ORAL
  Filled 2014-12-02: qty 2

## 2014-12-02 MED ORDER — SODIUM CHLORIDE 0.9 % IV SOLN: 250.0000 mL | INTRAVENOUS | Status: DC

## 2014-12-02 MED ORDER — DIAZEPAM 5 MG PO TABS
5.0000 mg | ORAL_TABLET | Freq: Four times a day (QID) | ORAL | Status: DC | PRN
  Administered 2014-12-02 – 2014-12-03 (×4): 5 mg via ORAL
  Filled 2014-12-02 (×4): qty 1

## 2014-12-02 MED ORDER — 0.9 % SODIUM CHLORIDE (POUR BTL) OPTIME
TOPICAL | Status: DC | PRN
  Administered 2014-12-02: 3000 mL

## 2014-12-02 MED ORDER — MIDAZOLAM HCL 5 MG/5ML IJ SOLN
INTRAMUSCULAR | Status: DC | PRN
  Administered 2014-12-02: 2 mg via INTRAVENOUS

## 2014-12-02 MED ORDER — SUCCINYLCHOLINE CHLORIDE 20 MG/ML IJ SOLN
INTRAMUSCULAR | Status: DC | PRN
  Administered 2014-12-02: 100 mg via INTRAVENOUS

## 2014-12-02 MED ORDER — FENTANYL CITRATE 0.05 MG/ML IJ SOLN
INTRAMUSCULAR | Status: DC | PRN
  Administered 2014-12-02: 150 ug via INTRAVENOUS
  Administered 2014-12-02 (×4): 50 ug via INTRAVENOUS

## 2014-12-02 MED ORDER — VECURONIUM BROMIDE 10 MG IV SOLR
INTRAVENOUS | Status: AC
  Filled 2014-12-02: qty 10

## 2014-12-02 MED ORDER — ARTIFICIAL TEARS OP OINT
TOPICAL_OINTMENT | OPHTHALMIC | Status: AC
  Filled 2014-12-02: qty 3.5

## 2014-12-02 MED ORDER — OXYCODONE HCL ER 10 MG PO T12A: 20.0000 mg | EXTENDED_RELEASE_TABLET | Freq: Two times a day (BID) | ORAL | Status: DC

## 2014-12-02 MED ORDER — ONDANSETRON HCL 4 MG/2ML IJ SOLN
INTRAMUSCULAR | Status: DC | PRN
  Administered 2014-12-02: 4 mg via INTRAVENOUS

## 2014-12-02 MED ORDER — OXYCODONE HCL 5 MG/5ML PO SOLN: 5.0000 mg | Freq: Once | ORAL | Status: DC | PRN

## 2014-12-02 MED ORDER — BUPIVACAINE-EPINEPHRINE 0.25% -1:200000 IJ SOLN
INTRAMUSCULAR | Status: DC | PRN
  Administered 2014-12-02: 6 mL
  Administered 2014-12-02: 21 mL

## 2014-12-02 MED ORDER — HYDROMORPHONE HCL 1 MG/ML IJ SOLN: 0.2500 mg | INTRAMUSCULAR | Status: DC | PRN

## 2014-12-02 MED ORDER — METHYLENE BLUE 1 % INJ SOLN
INTRAMUSCULAR | Status: AC
  Filled 2014-12-02: qty 10

## 2014-12-02 MED ORDER — GLYCOPYRROLATE 0.2 MG/ML IJ SOLN
INTRAMUSCULAR | Status: DC | PRN
  Administered 2014-12-02: .7 mg via INTRAVENOUS

## 2014-12-02 MED ORDER — THROMBIN 20000 UNITS EX KIT: PACK | CUTANEOUS | Status: DC | PRN

## 2014-12-02 MED ORDER — PROPOFOL 10 MG/ML IV BOLUS
INTRAVENOUS | Status: DC | PRN
  Administered 2014-12-02: 160 mg via INTRAVENOUS

## 2014-12-02 MED ORDER — SODIUM CHLORIDE 0.9 % IV SOLN: 66.2000 mg | INTRAVENOUS | Status: DC | PRN

## 2014-12-02 MED ORDER — NEOSTIGMINE METHYLSULFATE 10 MG/10ML IV SOLN
INTRAVENOUS | Status: DC | PRN
Start: 2014-12-02 — End: 2014-12-02
  Administered 2014-12-02: 4 mg via INTRAVENOUS

## 2014-12-02 MED ORDER — ONDANSETRON HCL 4 MG/2ML IJ SOLN: 4.0000 mg | INTRAMUSCULAR | Status: DC | PRN

## 2014-12-02 MED ORDER — PROMETHAZINE HCL 25 MG/ML IJ SOLN: 6.2500 mg | INTRAMUSCULAR | Status: DC | PRN

## 2014-12-02 MED ORDER — PROPOFOL INFUSION 10 MG/ML OPTIME
INTRAVENOUS | Status: DC | PRN
  Administered 2014-12-02: 50 ug/kg/min via INTRAVENOUS

## 2014-12-02 MED ORDER — PANTOPRAZOLE SODIUM 40 MG PO TBEC
40.0000 mg | DELAYED_RELEASE_TABLET | Freq: Every day | ORAL | Status: DC
  Administered 2014-12-03: 40 mg via ORAL
  Filled 2014-12-02: qty 1

## 2014-12-02 MED ORDER — THROMBIN 20000 UNITS EX SOLR
CUTANEOUS | Status: AC
  Filled 2014-12-02: qty 20000

## 2014-12-02 MED ORDER — MIDAZOLAM HCL 2 MG/2ML IJ SOLN
INTRAMUSCULAR | Status: AC
  Filled 2014-12-02: qty 2

## 2014-12-02 MED ORDER — POTASSIUM CHLORIDE IN NACL 20-0.9 MEQ/L-% IV SOLN
INTRAVENOUS | Status: DC
  Filled 2014-12-02 (×4): qty 1000

## 2014-12-02 MED ORDER — OXYCODONE-ACETAMINOPHEN 5-325 MG PO TABS: 1.0000 | ORAL_TABLET | ORAL | Status: DC | PRN

## 2014-12-02 MED ORDER — VECURONIUM BROMIDE 10 MG IV SOLR
INTRAVENOUS | Status: DC | PRN
  Administered 2014-12-02: 4 mg via INTRAVENOUS
  Administered 2014-12-02: 2 mg via INTRAVENOUS

## 2014-12-02 MED ORDER — ALBUMIN HUMAN 5 % IV SOLN
INTRAVENOUS | Status: DC | PRN
  Administered 2014-12-02: 09:00:00 via INTRAVENOUS

## 2014-12-02 MED ORDER — ZOLPIDEM TARTRATE 5 MG PO TABS: 5.0000 mg | ORAL_TABLET | Freq: Every evening | ORAL | Status: DC | PRN

## 2014-12-02 MED ORDER — THROMBIN 20000 UNITS EX SOLR
CUTANEOUS | Status: DC | PRN
  Administered 2014-12-02: 20 mL via TOPICAL

## 2014-12-02 MED ORDER — SODIUM CHLORIDE 0.9 % IJ SOLN
3.0000 mL | Freq: Two times a day (BID) | INTRAMUSCULAR | Status: DC
  Administered 2014-12-02: 3 mL via INTRAVENOUS

## 2014-12-02 MED ORDER — CEFAZOLIN SODIUM 1-5 GM-% IV SOLN
1.0000 g | Freq: Three times a day (TID) | INTRAVENOUS | Status: AC
  Administered 2014-12-02 – 2014-12-03 (×2): 1 g via INTRAVENOUS
  Filled 2014-12-02 (×2): qty 50

## 2014-12-02 MED ORDER — METHYLPREDNISOLONE ACETATE 40 MG/ML IJ SUSP
INTRAMUSCULAR | Status: AC
  Filled 2014-12-02: qty 1

## 2014-12-02 SURGICAL SUPPLY — 81 items
BENZOIN TINCTURE PRP APPL 2/3 (GAUZE/BANDAGES/DRESSINGS) ×2 IMPLANT
BLADE SURG ROTATE 9660 (MISCELLANEOUS) ×2 IMPLANT
BUR PRESCISION 1.7 ELITE (BURR) ×2 IMPLANT
BUR ROUND PRECISION 4.0 (BURR) ×2 IMPLANT
CAGE CONCORDE BULLET 9X11X27 (Cage) ×1 IMPLANT
CAGE SPNL PRLL BLT NOSE 27X9 (Cage) ×1 IMPLANT
CARTRIDGE OIL MAESTRO DRILL (MISCELLANEOUS) ×1 IMPLANT
CONT SPEC STER OR (MISCELLANEOUS) IMPLANT
COVER MAYO STAND STRL (DRAPES) ×4 IMPLANT
COVER SURGICAL LIGHT HANDLE (MISCELLANEOUS) ×2 IMPLANT
DIFFUSER DRILL AIR PNEUMATIC (MISCELLANEOUS) ×2 IMPLANT
DRAIN CHANNEL 15F RND FF W/TCR (WOUND CARE) IMPLANT
DRAPE C-ARM 42X72 X-RAY (DRAPES) ×2 IMPLANT
DRAPE C-ARMOR (DRAPES) ×2 IMPLANT
DRAPE ORTHO SPLIT 77X108 STRL (DRAPES) ×1
DRAPE POUCH INSTRU U-SHP 10X18 (DRAPES) ×2 IMPLANT
DRAPE SURG 17X23 STRL (DRAPES) ×8 IMPLANT
DRAPE SURG ORHT 6 SPLT 77X108 (DRAPES) ×1 IMPLANT
DURAPREP 26ML APPLICATOR (WOUND CARE) ×2 IMPLANT
ELECT BLADE 4.0 EZ CLEAN MEGAD (MISCELLANEOUS) ×2
ELECT CAUTERY BLADE 6.4 (BLADE) ×2 IMPLANT
ELECT REM PT RETURN 9FT ADLT (ELECTROSURGICAL) ×2
ELECTRODE BLDE 4.0 EZ CLN MEGD (MISCELLANEOUS) ×1 IMPLANT
ELECTRODE REM PT RTRN 9FT ADLT (ELECTROSURGICAL) ×1 IMPLANT
EVACUATOR SILICONE 100CC (DRAIN) IMPLANT
GAUZE SPONGE 4X4 12PLY STRL (GAUZE/BANDAGES/DRESSINGS) ×2 IMPLANT
GAUZE SPONGE 4X4 16PLY XRAY LF (GAUZE/BANDAGES/DRESSINGS) ×2 IMPLANT
GLOVE BIO SURGEON STRL SZ7 (GLOVE) ×2 IMPLANT
GLOVE BIO SURGEON STRL SZ8 (GLOVE) ×2 IMPLANT
GLOVE BIOGEL PI IND STRL 7.0 (GLOVE) ×1 IMPLANT
GLOVE BIOGEL PI IND STRL 8 (GLOVE) ×1 IMPLANT
GLOVE BIOGEL PI INDICATOR 7.0 (GLOVE) ×1
GLOVE BIOGEL PI INDICATOR 8 (GLOVE) ×1
GOWN STRL REUS W/ TWL LRG LVL3 (GOWN DISPOSABLE) ×2 IMPLANT
GOWN STRL REUS W/ TWL XL LVL3 (GOWN DISPOSABLE) ×1 IMPLANT
GOWN STRL REUS W/TWL LRG LVL3 (GOWN DISPOSABLE) ×2
GOWN STRL REUS W/TWL XL LVL3 (GOWN DISPOSABLE) ×1
IV CATH 14GX2 1/4 (CATHETERS) ×2 IMPLANT
KIT BASIN OR (CUSTOM PROCEDURE TRAY) ×2 IMPLANT
KIT POSITION SURG JACKSON T1 (MISCELLANEOUS) ×2 IMPLANT
KIT ROOM TURNOVER OR (KITS) ×2 IMPLANT
MARKER SKIN DUAL TIP RULER LAB (MISCELLANEOUS) ×4 IMPLANT
MIX DBX 10CC 35% BONE (Bone Implant) ×2 IMPLANT
NDL SAFETY ECLIPSE 18X1.5 (NEEDLE) ×1 IMPLANT
NEEDLE 22X1 1/2 (OR ONLY) (NEEDLE) ×2 IMPLANT
NEEDLE BONE MARROW 8GX6 FENEST (NEEDLE) IMPLANT
NEEDLE HYPO 18GX1.5 SHARP (NEEDLE) ×1
NEEDLE HYPO 25GX1X1/2 BEV (NEEDLE) ×2 IMPLANT
NEEDLE SPNL 18GX3.5 QUINCKE PK (NEEDLE) ×4 IMPLANT
NEURO MONITORING STIM (LABOR (TRAVEL & OVERTIME)) ×2 IMPLANT
NS IRRIG 1000ML POUR BTL (IV SOLUTION) ×6 IMPLANT
OIL CARTRIDGE MAESTRO DRILL (MISCELLANEOUS) ×2
PACK LAMINECTOMY ORTHO (CUSTOM PROCEDURE TRAY) ×2 IMPLANT
PACK UNIVERSAL I (CUSTOM PROCEDURE TRAY) ×2 IMPLANT
PAD ARMBOARD 7.5X6 YLW CONV (MISCELLANEOUS) ×4 IMPLANT
PATTIES SURGICAL .5 X1 (DISPOSABLE) ×2 IMPLANT
PATTIES SURGICAL .5X1.5 (GAUZE/BANDAGES/DRESSINGS) ×2 IMPLANT
PEN SKIN MARKING BROAD (MISCELLANEOUS) ×2 IMPLANT
ROD PRE BENT EXP 40MM (Rod) ×4 IMPLANT
SCREW EXPEDIUM POLYAXIAL 7X40M (Screw) ×8 IMPLANT
SCREW SET SINGLE INNER (Screw) ×8 IMPLANT
SPONGE INTESTINAL PEANUT (DISPOSABLE) ×2 IMPLANT
SPONGE SURGIFOAM ABS GEL 100 (HEMOSTASIS) ×2 IMPLANT
STRIP CLOSURE SKIN 1/2X4 (GAUZE/BANDAGES/DRESSINGS) ×2 IMPLANT
SURGIFLO TRUKIT (HEMOSTASIS) IMPLANT
SUT BONE WAX W31G (SUTURE) ×2 IMPLANT
SUT MNCRL AB 4-0 PS2 18 (SUTURE) ×2 IMPLANT
SUT VIC AB 0 CT1 18XCR BRD 8 (SUTURE) ×1 IMPLANT
SUT VIC AB 0 CT1 8-18 (SUTURE) ×1
SUT VIC AB 1 CT1 18XCR BRD 8 (SUTURE) ×1 IMPLANT
SUT VIC AB 1 CT1 8-18 (SUTURE) ×1
SUT VIC AB 2-0 CT2 18 VCP726D (SUTURE) ×2 IMPLANT
SYR 20CC LL (SYRINGE) ×2 IMPLANT
SYR BULB IRRIGATION 50ML (SYRINGE) ×2 IMPLANT
SYR CONTROL 10ML LL (SYRINGE) ×4 IMPLANT
SYR TB 1ML LUER SLIP (SYRINGE) ×2 IMPLANT
TOWEL OR 17X24 6PK STRL BLUE (TOWEL DISPOSABLE) ×2 IMPLANT
TOWEL OR 17X26 10 PK STRL BLUE (TOWEL DISPOSABLE) ×2 IMPLANT
TRAY FOLEY CATH 16FRSI W/METER (SET/KITS/TRAYS/PACK) ×2 IMPLANT
WATER STERILE IRR 1000ML POUR (IV SOLUTION) IMPLANT
YANKAUER SUCT BULB TIP NO VENT (SUCTIONS) ×2 IMPLANT

## 2014-12-02 NOTE — Transfer of Care (Signed)
Immediate Anesthesia Transfer of Care Note  Patient: Aaron Simon  Procedure(s) Performed: Procedure(s) with comments: Right sided lumbar 5- sacrum 1 transforaminal lumbar interbody fusion with instrumentation and allograft  (LEVEL 1) (Right) - Right sided lumbar 5- sacrum 1 transforaminal lumbar interbody fusion with instrumentation and allograft  Patient Location: PACU  Anesthesia Type:General  Level of Consciousness: awake  Airway & Oxygen Therapy: Patient Spontanous Breathing and Patient connected to nasal cannula oxygen  Post-op Assessment: Report given to RN and Post -op Vital signs reviewed and stable  Post vital signs: Reviewed and stable  Last Vitals:  Filed Vitals:   12/02/14 0550  BP: 140/94  Pulse: 80  Temp: 36.7 C  Resp: 20    Complications: No apparent anesthesia complications

## 2014-12-02 NOTE — Anesthesia Postprocedure Evaluation (Signed)
  Anesthesia Post-op Note  Patient: Aaron Simon  Procedure(s) Performed: Procedure(s) with comments: Right sided lumbar 5- sacrum 1 transforaminal lumbar interbody fusion with instrumentation and allograft  (LEVEL 1) (Right) - Right sided lumbar 5- sacrum 1 transforaminal lumbar interbody fusion with instrumentation and allograft  Patient Location: PACU  Anesthesia Type:General  Level of Consciousness: awake and alert   Airway and Oxygen Therapy: Patient Spontanous Breathing  Post-op Pain: mild  Post-op Assessment: Post-op Vital signs reviewed  Post-op Vital Signs: stable  Last Vitals:  Filed Vitals:   12/02/14 1315  BP:   Pulse: 78  Temp:   Resp: 15    Complications: No apparent anesthesia complications

## 2014-12-02 NOTE — Progress Notes (Signed)
Forehead sl. Reddened upon adm to [facility] says it is from BIS monitor in OR.

## 2014-12-02 NOTE — Anesthesia Preprocedure Evaluation (Signed)
Anesthesia Evaluation  Patient identified by MRN, date of birth, ID band Patient awake    Reviewed: Allergy & Precautions, NPO status , Patient's Chart, lab work & pertinent test results  History of Anesthesia Complications (+) PROLONGED EMERGENCE and Emergence Delirium  Airway Mallampati: I       Dental   Pulmonary neg pulmonary ROS,  breath sounds clear to auscultation        Cardiovascular negative cardio ROS  Rhythm:Regular Rate:Normal     Neuro/Psych    GI/Hepatic Neg liver ROS, GERD-  ,  Endo/Other    Renal/GU negative Renal ROS     Musculoskeletal  (+) Arthritis -,   Abdominal   Peds  Hematology negative hematology ROS (+)   Anesthesia Other Findings   Reproductive/Obstetrics                             Anesthesia Physical Anesthesia Plan  ASA: I  Anesthesia Plan: General   Post-op Pain Management:    Induction: Intravenous  Airway Management Planned: Oral ETT  Additional Equipment:   Intra-op Plan:   Post-operative Plan: Extubation in OR  Informed Consent: I have reviewed the patients History and Physical, chart, labs and discussed the procedure including the risks, benefits and alternatives for the proposed anesthesia with the patient or authorized representative who has indicated his/her understanding and acceptance.   Dental advisory given  Plan Discussed with: CRNA and Surgeon  Anesthesia Plan Comments:         Anesthesia Quick Evaluation

## 2014-12-02 NOTE — Anesthesia Procedure Notes (Signed)
Procedure Name: Intubation Date/Time: 12/02/2014 7:44 AM Performed by: Eligha Bridegroom Pre-anesthesia Checklist: Emergency Drugs available, Patient identified, Timeout performed, Suction available and Patient being monitored Patient Re-evaluated:Patient Re-evaluated prior to inductionOxygen Delivery Method: Circle system utilized Preoxygenation: Pre-oxygenation with 100% oxygen Intubation Type: IV induction Ventilation: Mask ventilation without difficulty Grade View: Grade II Tube type: Oral Tube size: 7.5 mm Number of attempts: 1 Airway Equipment and Method: Stylet and LTA kit utilized Placement Confirmation: ETT inserted through vocal cords under direct vision,  breath sounds checked- equal and bilateral and positive ETCO2 Secured at: 21 cm Tube secured with: Tape Dental Injury: Teeth and Oropharynx as per pre-operative assessment

## 2014-12-03 MED FILL — Sodium Chloride IV Soln 0.9%: INTRAVENOUS | Qty: 1000 | Status: AC

## 2014-12-03 MED FILL — Heparin Sodium (Porcine) Inj 1000 Unit/ML: INTRAMUSCULAR | Qty: 30 | Status: AC

## 2014-12-03 MED FILL — Sodium Chloride Irrigation Soln 0.9%: Qty: 3000 | Status: AC

## 2014-12-03 NOTE — Progress Notes (Signed)
Pt given D/C instructions with Rx's, verbal understanding was provided. Pt's incision is clean and dry with no sign of infection. Pt's IV was removed prior to D/C. Pt D/C'd home via walking @ 1700 per MD order. Pt is stable @ D/C and has no other needs at this time. Holli Humbles, RN

## 2014-12-03 NOTE — Progress Notes (Signed)
    Patient doing well Patient denies leg pain Pain controlled with Ultram    Physical Exam: Filed Vitals:   12/03/14 0400  BP: 96/55  Pulse: 85  Temp: 99.9 F (37.7 C)  Resp: 16    Dressing in place NVI Looks excellent  POD #1 s/p revision decompression L5/S1 with fusion  - up with PT/OT, encourage ambulation - Ultram for pain, Valium for muscle spasms - will d/c home today, per patient's request - brace when up and ambulating

## 2014-12-03 NOTE — Evaluation (Signed)
Physical Therapy Evaluation and D/C Patient Details Name: Aaron Simon MRN: [id number] DOB: 08/27/1966 Today's Date: 12/03/2014   History of Present Illness  s/p revision decompression L5/S1 with fusion  Clinical Impression  Pt admitted with above diagnosis. Pt currently with all education complete and without significant functional limitations.  Ambulating with independence.  Pt will not need any further skilled PT at this time.  Will sign off.   Follow Up Recommendations No PT follow up    Equipment Recommendations  3in1 (PT) (pt considering cane)    Recommendations for Other Services       Precautions / Restrictions Precautions Precautions: Back Precaution Booklet Issued: Yes (comment) Precaution Comments: educated as to precautions and handout. Required Braces or Orthoses: Spinal Brace Spinal Brace: Thoracolumbosacral orthotic;Applied in sitting position (can go to bathroom without brace) Restrictions Weight Bearing Restrictions: No      Mobility  Bed Mobility Overal bed mobility: Needs Assistance Bed Mobility: Sidelying to Sit;Sit to Sidelying   Sidelying to sit: Supervision (educated on back precautions. Good log rolling technique)     Sit to sidelying: Supervision (used appropriate technique) General bed mobility comments: Pt states he has been getting up during the night.  Pt doffed brace independently.   Transfers Overall transfer level: Needs assistance Equipment used: None Transfers: Sit to/from Stand Sit to Stand: Supervision Stand pivot transfers: Supervision       General transfer comment: educated on proper body mecahnics. TOok incr time to scoot to [place].   Pt manipulates parts of brace including leg piece appropriately without cues.    Ambulation/Gait Ambulation/Gait assistance: Supervision Ambulation Distance (Feet): 300 Feet Assistive device: None Gait Pattern/deviations: Step-through pattern;Decreased stride length   Gait velocity  interpretation: Below normal speed for age/gender General Gait Details: Pt with very guarded gait.  No LOB but lacks arm swing.  Discussed that pt may want to pick up a cane for home use as he tends to reach for furniture at times.    Stairs Stairs: Yes Stairs assistance: Supervision Stair Management: One rail Left;Step to pattern;Forwards Number of Stairs: 3 General stair comments: Pt able to perform steps without difficulty.  Unlocks brace appropriately with good technique.   Wheelchair Mobility    Modified Rankin (Stroke Patients Only)       Balance Overall balance assessment: Needs assistance         Standing balance support: No upper extremity supported;During functional activity Standing balance-Leahy Scale: Fair                               Pertinent Vitals/Pain Pain Assessment: 0-10 Pain Score: 3  Pain Location: back Pain Descriptors / Indicators: Aching Pain Intervention(s): Limited activity within patient's tolerance;Monitored during session;Repositioned  VSS    Home Living Family/patient expects to be discharged to:: Private residence Living Arrangements: Spouse/significant other Available Help at Discharge: Family;Available PRN/intermittently Type of Home: House Home Access: Stairs to enter Entrance Stairs-Rails: Right Entrance Stairs-Number of Steps: 3 Home Layout: Multi-level;Able to live on main level with bedroom/bathroom Home Equipment: None      Prior Function Level of Independence: Independent               Hand Dominance   Dominant Hand: Right    Extremity/Trunk Assessment   Upper Extremity Assessment: Defer to OT evaluation           Lower Extremity Assessment: RLE deficits/detail RLE Deficits / Details: grossly 3/5  Cervical / Trunk Assessment: Normal  Communication   Communication: No difficulties  Cognition Arousal/Alertness: Awake/alert Behavior During Therapy: WFL for tasks  assessed/performed Overall Cognitive Status: Within Functional Limits for tasks assessed                      General Comments      Exercises        Assessment/Plan    PT Assessment Patent does not need any further PT services  PT Diagnosis Generalized weakness;Acute pain   PT Problem List Decreased activity tolerance;Decreased mobility;Decreased balance;Decreased knowledge of use of DME;Decreased safety awareness;Decreased knowledge of precautions;Pain  PT Treatment Interventions Therapeutic activities;Functional mobility training;Stair training;Gait training;Patient/family education   PT Goals (Current goals can be found in the Care Plan section) Acute Rehab PT Goals Patient Stated Goal: to go home PT Goal Formulation: All assessment and education complete, DC therapy    Frequency     Barriers to discharge Decreased caregiver support (wife works so he will be alone during day)      Co-evaluation               End of Session Equipment Utilized During Treatment: Gait belt;Back brace Activity Tolerance: Patient limited by pain;Patient limited by fatigue Patient left: in chair;with call bell/phone within reach Nurse Communication: Mobility status         Time: 6237-6283 PT Time Calculation (min) (ACUTE ONLY): 21 min   Charges:   PT Evaluation $Initial PT Evaluation Tier I: 1 Procedure     PT G CodesDenice Paradise 2014-12-28, 12:23 PM Keiko Myricks [facility] [phone] (pager)

## 2014-12-03 NOTE — Op Note (Signed)
NAMETAVARAS, GOODY NO.:  [id number]  MEDICAL RECORD NO.:  [id number]  LOCATION:  3C06C                        FACILITY:  [facility]  PHYSICIAN:  Phylliss Bob, MD      DATE OF BIRTH:  03/19/1966  DATE OF PROCEDURE:  12/02/2014                              OPERATIVE REPORT   PREOPERATIVE DIAGNOSES: 1. 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 2. Status post previous L5-S1 decompression 2-1/2 years ago. 3. L5-S1 instability noted intraoperatively requiring L5-S1 fusion and     instrumentation.  POSTOPERATIVE DIAGNOSES: 1. 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 2. Status post previous L5-S1 decompression 2-1/2 years ago. 3. L5-S1 instability noted intraoperatively requiring L5-S1 fusion and     instrumentation.  PROCEDURE: 1. Revision decompression, L5-S1, with removal of large extruded right     L5-S1 disc herniation.  Of note, the decompression did require     substantial bone removal which did result in instability requiring     an L5-S1 fusion procedure. 2. Right-sided L5-S1 transforaminal lumbar interbody fusion. 3. Left-sided L5-S1 posterolateral fusion. 4. Insertion of interbody device x1 (11 x 27 mm Concorde bullet cage.) 5. Placement of posterior instrumentation, L5, S1 (7 x 40 mm screws). 6. Use of local autograft. 7. Use of morselized allograft. 8. Intraoperative use of fluoroscopy.  SURGEON:  Phylliss Bob, MD  ASSISTANT:  Loni Dolly, PA-C.  ANESTHESIA:  General endotracheal anesthesia.  COMPLICATIONS:  None.  DISPOSITION:  Stable.  ESTIMATED BLOOD LOSS:  100 mL.  INDICATIONS FOR SURGERY:  Briefly, Mr. Arlington is a very pleasant 49- year-old male, who is status post a previous L5-S1 decompressive procedure.  The patient did extremely well following that procedure. However, did go on to have substantial pain in his right leg  more recently.  An MRI did reveal a recurrent large right L5-S1 disc herniation, clearly causing compression of the traversing S1 nerve on the right side.  We had discussed options.  He did fail conservative care and we did discuss proceeding with a decompressive procedure.  The patient did understand that there was a high-risk of intraoperative instability, which may require a fusion.  He did elect to proceed.  OPERATIVE DETAILS:  On December 02, 2014, the patient was brought to Surgery and general endotracheal anesthesia was administered.  The patient was placed prone on a well-padded flat Jackson bed with a spinal frame.  Antibiotics were given and the back was prepped and draped and a time-out procedure was performed.  I then utilized the patient's previous incision which was extended slightly superiorly and inferiorly. The fascia was incised at the midline.  I then proceeded with the decompressive aspect of the procedure after identifying the appropriate operative level.  Specifically, a lateral fluoroscopic view confirmed the appropriate operative level.  Next, I did perform a partial facetectomy on the right side removing the inferior aspect of L5 and the superior aspect of S1.  The traversing S1 nerve was noted, however, I was unable to perform a safe medial retraction of the nerve.  I,  therefore, did have to continue the exposure which did require additional facetectomy.  I did ultimately have to perform a full facetectomy on the right.  I then was able to safely medially retract the nerve, after which point a very large herniated L5-S1 intervertebral disc was noted.  The herniation was removed and for rather large fragments.  In doing so, I was able to adequately decompress the traversing S1 nerve.  With the facetectomy having been performed, I did assess the stability of the L5-S1 intervertebral level, and I did feel that the levels unstable requiring a fusion procedure.  I,  therefore, proceeded with cannulating the L5 and S1 pedicles on the right and on the left.  I did use a 4 mm bur followed by a curved gearshift probe, followed by a 6 mm tap.  I did use intraoperative fluoroscopy while cannulating the L5 and S1 pedicles bilaterally.  I was very pleased with the trajectory of the cannulated pedicles.  On the left side, I did expose the posterolateral gutter, in addition to the L5-S1 facet joint. The gutter and the facet joint was decorticated using a high-speed bur. I then packed allograft in the form of DBX mix into the posterolateral gutter.  I then placed 7 x 40 mm screws at L5 and at S1 on the left side.  A 40 mm rod was placed and distraction was applied across the rod and caps were placed.  On the right side, with an assistant holding medial retraction of the traversing S1 nerve, I did use a 15-blade knife to perform an annulotomy at the posterolateral aspect of the disc.  I then used a series of curettes and pituitary rongeurs to perform a thorough and complete intervertebral diskectomy.  The endplates were then appropriately prepared to bleeding bone.  I then placed a series of interbody spacer trials and I did feel that an 11 x 27 mm intervertebral spacer would be the most appropriate fit.  The appropriate size interbody spacer was then packed with the DBX mix.  The interbody space was then liberally packed with autograft from the decompression, in addition to allograft in the form of the DBX mix.  The interbody spacer was then tamped into position.  I was very pleased with the press fit of the implant.  I then placed 7 x 40 mm screws at L5 and at S1 on the right side.  Distraction was discontinued on the left side.  A 40 mm rod was placed on the right.  Caps were then placed and a final locking procedure was performed.  I was very pleased with the final AP and lateral fluoroscopic images.  Of note, there was no abnormal EMG activity noted  throughout the entire surgery.  I explored the wound for any undue bleeding or for extravasation of cerebrospinal fluid, and there was none encountered.  Of note, the wound was copiously irrigated with approximately 2 L of normal saline throughout the surgery.  The fascia was then closed using #1 Vicryl.  The subcutaneous layer was closed using 2-0 Vicryl and the skin was closed using 3-0 Monocryl.  Benzoin and Steri-Strips were applied followed by sterile dressing.  All instrument counts are correct at the termination of the procedure.  There was no sustained abnormal EMG activity noted throughout the surgery.  Of note, Loni Dolly was my assistant throughout the surgery, and did aid in retraction, suctioning, and closure.     Phylliss Bob, MD     MD/MEDQ  D:  12/02/2014  T:  12/03/2014  Job:  [id number]  cc:   Jenean Lindau, MD

## 2014-12-03 NOTE — Progress Notes (Signed)
Occupational Therapy Evaluation Patient Details Name: Aaron Simon MRN: [id number] DOB: 1966-03-29 Today's Date: 12/03/2014    History of Present Illness s/p revision decompression L5/S1 with fusion   Clinical Impression   Completed education regarding back precautions for ADL and functional mobility. Excellent progress. Pt able to return demonstrate. Pt ready to D/C when medically stable with intermittent S.     Follow Up Recommendations  No OT follow up;Supervision - Intermittent  Maurie Boettcher, OTR/L  [phone] 12/03/2014  Equipment Recommendations  3 in 1 bedside comode    Recommendations for Other Services       Precautions / Restrictions Precautions Precautions: Back Precaution Booklet Issued: Yes (comment) Required Braces or Orthoses: Spinal Brace Spinal Brace: Thoracolumbosacral orthotic;Applied in sitting position (can go to bathroom without brace) Restrictions Weight Bearing Restrictions: No      Mobility Bed Mobility Overal bed mobility: Needs Assistance Bed Mobility: Sidelying to Sit   Sidelying to sit: Supervision (educated on back precautions. Good log rolling technique)       General bed mobility comments: Pt states he has been getting up during the night  Transfers Overall transfer level: Needs assistance   Transfers: Sit to/from Stand;Stand Pivot Transfers Sit to Stand: Supervision Stand pivot transfers: Supervision       General transfer comment: educated on proper body mecahnics    Balance Overall balance assessment: No apparent balance deficits (not formally assessed)                                          ADL Overall ADL's : Needs assistance/impaired     Grooming: Oral care;Wash/dry face;Wash/dry hands;Set up;Supervision/safety   Upper Body Bathing: Set up;Supervision/ safety;Sitting   Lower Body Bathing: Moderate assistance;Sit to/from stand   Upper Body Dressing : Minimal assistance Upper Body Dressing  Details (indicate cue type and reason): donning TLSO Lower Body Dressing: Moderate assistance;Sit to/from stand Lower Body Dressing Details (indicate cue type and reason): Educated on LB ADL techniques with  and without AE Toilet Transfer: Min guard;Ambulation Toilet Transfer Details (indicate cue type and reason): more difficulty rsining from lower surfaces Toileting- Clothing Manipulation and Hygiene: Moderate assistance;Sit to/from stand       Functional mobility during ADLs: Supervision/safety;Cueing for safety General ADL Comments: Educated pt on use of AE and compensatory techniques for ADL. Completed ADL session with pt. Pt able to return demonstrate techniques. Also educated pt on techniques to donn/doff TLSO. discussed option of bracing self against wall to adjust brace. Pt asking quesitons about level of activity. encouraged pt to change positions throughout the day, limiting sitting to 1 hr and to build in rest periods during the day in sidelying or supine.     Vision     Perception     Praxis      Pertinent Vitals/Pain       Hand Dominance Right   Extremity/Trunk Assessment Upper Extremity Assessment Upper Extremity Assessment: Overall WFL for tasks assessed   Lower Extremity Assessment Lower Extremity Assessment: RLE deficits/detail (weakness) RLE Sensation:  (Dorsum of foot numb)   Cervical / Trunk Assessment Cervical / Trunk Assessment: Normal   Communication Communication Communication: No difficulties   Cognition Arousal/Alertness: Awake/alert Behavior During Therapy: WFL for tasks assessed/performed Overall Cognitive Status: Within Functional Limits for tasks assessed  General Comments       Exercises       Shoulder Instructions      Home Living Family/patient expects to be discharged to:: Private residence Living Arrangements: Spouse/significant other Available Help at Discharge: Family;Available  PRN/intermittently Type of Home: House Home Access: Stairs to enter [employer] of Steps: 3 Entrance Stairs-Rails: Right Home Layout: Multi-level;Able to live on main level with bedroom/bathroom     Bathroom Shower/Tub: Tub/shower unit;Curtain Shower/tub characteristics: Architectural technologist: Standard Bathroom Accessibility: Yes How Accessible: Accessible via walker Home Equipment: None          Prior Functioning/Environment Level of Independence: Independent             OT Diagnosis: Generalized weakness;Acute pain   OT Problem List: Decreased strength;Decreased range of motion;Decreased activity tolerance;Decreased knowledge of use of DME or AE;Decreased knowledge of precautions;Pain   OT Treatment/Interventions:      OT Goals(Current goals can be found in the care plan section) Acute Rehab OT Goals Patient Stated Goal: to go home OT Goal Formulation: All assessment and education complete, DC therapy  OT Frequency:     Barriers to D/C:            Co-evaluation              End of Session Equipment Utilized During Treatment: Back brace Nurse Communication: Mobility status;Other (comment) (D/C needs)  Activity Tolerance: Patient tolerated treatment well Patient left: in chair;with call bell/phone within reach   Time: 2751-7001 OT Time Calculation (min): 38 min Charges:  OT General Charges $OT Visit: 1 Procedure OT Evaluation $Initial OT Evaluation Tier I: 1 Procedure OT Treatments $Self Care/Home Management : 23-37 mins G-Codes:    Yuta Cipollone,HILLARY 12-09-2014, 9:50 AM

## 2014-12-08 NOTE — Discharge Summary (Signed)
Patient ID: Aaron Simon MRN: [id number] DOB/AGE: 03-29-1966 49 y.o.  Admit date: 12/02/2014 Discharge date: 12/03/2014  Admission Diagnoses:  Active Problems:   HNP (herniated nucleus pulposus), lumbar   Discharge Diagnoses:  Same  Past Medical History  Diagnosis Date  . Back pain   . Complication of anesthesia     Mental "goofy".  Sats dropped in PACU. "Does not take much"  . GERD (gastroesophageal reflux disease)     Surgeries: Procedure(s): Right sided lumbar 5- sacrum 1 decompression and transforaminal lumbar interbody fusion with instrumentation and allograft  (LEVEL 1) on 12/02/2014   Consultants:  None  Discharged Condition: Improved  Hospital Course: JAKAVION BILODEAU is an 49 y.o. male who was admitted 12/02/2014 for operative treatment of radiculopathy. Patient has severe unremitting pain that affects sleep, daily activities, and work/hobbies. After pre-op clearance the patient was taken to the operating room on 12/02/2014 and underwent  Procedure(s): Right sided lumbar 5- sacrum 1 transforaminal lumbar interbody fusion with instrumentation and allograft  (LEVEL 1).    Patient was given perioperative antibiotics:  Anti-infectives    Start     Dose/Rate Route Frequency Ordered Stop   12/02/14 1800  ceFAZolin (ANCEF) IVPB 1 g/50 mL premix     1 g 100 mL/hr over 30 Minutes Intravenous Every 8 hours 12/02/14 1340 12/03/14 0304   12/02/14 0600  ceFAZolin (ANCEF) IVPB 2 g/50 mL premix     2 g 100 mL/hr over 30 Minutes Intravenous On call to O.R. 12/01/14 1303 12/02/14 1200       Patient was given sequential compression devices, early ambulation to prevent DVT.  Patient benefited maximally from hospital stay and there were no complications.    Recent vital signs: BP 103/74 mmHg  Pulse 107  Temp(Src) 98.2 F (36.8 C) (Oral)  Resp 16  Ht 5\' 8"  (1.727 m)  Wt 66.225 kg (146 lb)  BMI 22.20 kg/m2  SpO2 100%  Discharge Medications:     Medication List    STOP  taking these medications        traMADol 50 MG tablet  Commonly known as:  ULTRAM      TAKE these medications        dexlansoprazole 60 MG capsule  Commonly known as:  DEXILANT  Take 60 mg by mouth daily.        Diagnostic Studies: Dg Chest 2 View  11/23/2014   CLINICAL DATA:  Back pain.  Lumbar fusion.  EXAM: CHEST  2 VIEW  COMPARISON:  04/14/2012.  FINDINGS: The heart size and mediastinal contours are within normal limits. Both lungs are clear. Minimal scoliosis, no acute bony.  IMPRESSION: No active cardiopulmonary disease.   Electronically Signed   By: Marcello Moores  Register   On: 11/23/2014 15:11   Dg Lumbar Spine 2-3 Views  12/02/2014   CLINICAL DATA:  L5-S1 fusion  EXAM: LUMBAR SPINE - 2-3 VIEW; DG C-ARM GT 120 MIN  COMPARISON:  Intraoperative imaging 12/02/2014.  FLUOROSCOPY TIME:  Radiation Exposure Index (as provided by the fluoroscopic device):  If the device does not provide the exposure index:  Fluoroscopy Time:  32 seconds  Number of Acquired Images:  None  FINDINGS: 2 intraoperative images demonstrate changes of posterior fusion at L5-S1. Normal alignment. No complicating feature.  IMPRESSION: L5-S1 posterior fusion.   Electronically Signed   By: Rolm Baptise M.D.   On: 12/02/2014 11:39   Dg Lumbar Spine 1 View  12/02/2014   CLINICAL DATA:  Lumbar spine surgery.  EXAM: LUMBAR SPINE - 1 VIEW  COMPARISON:  MRI 10/08/2014.  FINDINGS: Lumbar spine numbered as per prior MRI. Degenerative changes lumbar spine. Posterior metallic markers are noted at the L4-L5 and L5-S1 disc space levels.  IMPRESSION: Posterior metallic markers are noted at the L4-L5 and L5-S1 disc space levels.   Electronically Signed   By: Marcello Moores  Register   On: 12/02/2014 09:52   Dg C-arm Gt 120 Min  12/02/2014   CLINICAL DATA:  L5-S1 fusion  EXAM: LUMBAR SPINE - 2-3 VIEW; DG C-ARM GT 120 MIN  COMPARISON:  Intraoperative imaging 12/02/2014.  FLUOROSCOPY TIME:  Radiation Exposure Index (as provided by the fluoroscopic  device):  If the device does not provide the exposure index:  Fluoroscopy Time:  32 seconds  Number of Acquired Images:  None  FINDINGS: 2 intraoperative images demonstrate changes of posterior fusion at L5-S1. Normal alignment. No complicating feature.  IMPRESSION: L5-S1 posterior fusion.   Electronically Signed   By: Rolm Baptise M.D.   On: 12/02/2014 11:39   Mr [employer] Films Spine  12/02/2014   CLINICAL DATA:    This exam is stored here for comparison purposes only and was performed at  an outside facility.   Please contact the originating institution for any  associated interpretation or report.     Disposition: 01-Home or Self Care   POD #1 s/p revision decompression L5/S1 with fusion  - up with PT/OT, encourage ambulation - Ultram for pain, Valium for muscle spasms - brace when up and ambulating -Written scripts for pain signed and in chart -D/C instructions sheet printed and in chart -D/C today  -F/U in office 2 weeks   Signed: Justice Britain 12/08/2014, 3:25 PM

## 2017-09-10 DIAGNOSIS — E785 Hyperlipidemia, unspecified: Secondary | ICD-10-CM | POA: Diagnosis not present

## 2017-09-10 DIAGNOSIS — K219 Gastro-esophageal reflux disease without esophagitis: Secondary | ICD-10-CM | POA: Diagnosis not present

## 2017-09-10 DIAGNOSIS — E663 Overweight: Secondary | ICD-10-CM | POA: Diagnosis not present

## 2017-09-10 DIAGNOSIS — Z1331 Encounter for screening for depression: Secondary | ICD-10-CM | POA: Diagnosis not present

## 2017-09-10 DIAGNOSIS — F4323 Adjustment disorder with mixed anxiety and depressed mood: Secondary | ICD-10-CM | POA: Diagnosis not present

## 2017-09-10 DIAGNOSIS — Z6826 Body mass index (BMI) 26.0-26.9, adult: Secondary | ICD-10-CM | POA: Diagnosis not present

## 2017-09-10 DIAGNOSIS — Z0000 Encounter for general adult medical examination without abnormal findings: Secondary | ICD-10-CM | POA: Diagnosis not present

## 2017-09-10 DIAGNOSIS — Z79899 Other long term (current) drug therapy: Secondary | ICD-10-CM | POA: Diagnosis not present

## 2017-09-10 MED FILL — LORazepam 1 MG TABS: 1 | 30 days supply | Qty: 60 | Fill #0

## 2017-10-09 DIAGNOSIS — J09X2 Influenza due to identified novel influenza A virus with other respiratory manifestations: Secondary | ICD-10-CM | POA: Diagnosis not present

## 2017-11-25 ENCOUNTER — Encounter: Payer: Self-pay | Admitting: Gastroenterology

## 2017-12-09 MED FILL — PANTOPRAZOLE SOD DR 40 MG T: 40 | 90 days supply | Qty: 90 | Fill #0 | Status: TO

## 2018-01-20 ENCOUNTER — Telehealth: Payer: Self-pay | Admitting: Gastroenterology

## 2018-01-20 NOTE — Telephone Encounter (Signed)
Okay, what do you need me to do with this? Did you get a fax number?

## 2018-01-21 NOTE — Telephone Encounter (Signed)
I have sent the EGD report that was requested.

## 2018-01-21 NOTE — Telephone Encounter (Signed)
Hey,  I spoke with Barbie Haggis and Barbera Setters and we did not know if ya'll had access to the records.  Medical Records is telling them that they did not know that Dr. Lyndel Safe even existed  Haha!  If you cannot help, I understand!  Thanks

## 2018-02-20 MED FILL — PANTOPRAZOLE SOD DR 40 MG T: 40 | 90 days supply | Qty: 90 | Fill #0

## 2018-04-08 DIAGNOSIS — H524 Presbyopia: Secondary | ICD-10-CM | POA: Diagnosis not present

## 2018-04-08 DIAGNOSIS — H5213 Myopia, bilateral: Secondary | ICD-10-CM | POA: Diagnosis not present

## 2018-04-16 DIAGNOSIS — E663 Overweight: Secondary | ICD-10-CM | POA: Diagnosis not present

## 2018-04-16 DIAGNOSIS — R079 Chest pain, unspecified: Secondary | ICD-10-CM | POA: Diagnosis not present

## 2018-04-16 DIAGNOSIS — Z6826 Body mass index (BMI) 26.0-26.9, adult: Secondary | ICD-10-CM | POA: Diagnosis not present

## 2018-04-22 ENCOUNTER — Telehealth: Payer: Self-pay | Admitting: *Deleted

## 2018-04-22 NOTE — Telephone Encounter (Signed)
Referral sent to scheduling from [facility], [place], [place].

## 2018-04-24 ENCOUNTER — Ambulatory Visit (INDEPENDENT_AMBULATORY_CARE_PROVIDER_SITE_OTHER): Payer: 59 | Admitting: Cardiology

## 2018-04-24 ENCOUNTER — Encounter: Payer: Self-pay | Admitting: *Deleted

## 2018-04-24 ENCOUNTER — Encounter: Payer: Self-pay | Admitting: Cardiology

## 2018-04-24 VITALS — BP 112/80 | HR 80 | Ht 68.0 in | Wt 173.2 lb

## 2018-04-24 DIAGNOSIS — R079 Chest pain, unspecified: Secondary | ICD-10-CM | POA: Diagnosis not present

## 2018-04-24 DIAGNOSIS — Z01812 Encounter for preprocedural laboratory examination: Secondary | ICD-10-CM | POA: Diagnosis not present

## 2018-04-24 DIAGNOSIS — Z8249 Family history of ischemic heart disease and other diseases of the circulatory system: Secondary | ICD-10-CM | POA: Diagnosis not present

## 2018-04-24 MED ORDER — METOPROLOL TARTRATE 50 MG PO TABS: 50.0000 mg | ORAL_TABLET | Freq: Once | ORAL | 0 refills | Status: DC

## 2018-04-24 NOTE — Progress Notes (Signed)
Cardiology Office Note:    Date:  04/24/2018   ID:  Rica Records, DOB 1966-06-17, MRN [id number]  PCP:  Marlinda Mike, RRT  Cardiologist:  No primary care provider on file.   Referring MD: Jeanie Sewer, NP    History of Present Illness:    Aaron Simon is a 53 y.o. male here for the evaluation of chest pain at the request of [facility].  His wife is PA with [employer].  He comes in today stating that he has had about 4 or 5 different episodes of left-sided chest discomfort early in the morning when he awakens from sleep with no radiation no significant shortness of breath, no diaphoresis.  Often he will take Advil and then sometimes forgets about the discomfort.  Does not seem to be exacerbated by exertional activity.  He is concerned because his father is a patient here, retired Company secretary and has had 2 separate heart attacks usually under stressful situations, once when he was having his retirement and once when his other son was moving in with him.  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  Tries to eat well, stay fit.  He used to weigh 158 pounds but then had a herniated disc and gained weight.  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   Past Medical History:  Diagnosis Date  . Back pain   . Complication of anesthesia    Mental "goofy".  Sats dropped in PACU. "Does not take much"  . GERD (gastroesophageal reflux disease)     Past Surgical History:  Procedure Laterality Date  . APPENDECTOMY    . BACK SURGERY    . COLONOSCOPY    . LUMBAR LAMINECTOMY/DECOMPRESSION MICRODISCECTOMY  04/17/2012   Procedure: LUMBAR LAMINECTOMY/DECOMPRESSION MICRODISCECTOMY;  Surgeon: Sinclair Ship, MD;  Location: [place];  Service: Orthopedics;  Laterality: Right;  Right sided lumbar 5-sacrum 1 microdisectomy  . UPPER GASTROINTESTINAL ENDOSCOPY      Current  Medications: Current Meds  Medication Sig  . pantoprazole (PROTONIX) 40 MG tablet Take 40 mg by mouth daily.     Allergies:   Patient has no known allergies.   Social History   Socioeconomic History  . Marital status: Married    Spouse name: Not on file  . Number of children: Not on file  . Years of education: Not on file  . Highest education level: Not on file  Occupational History  . Not on file  Social Needs  . Financial resource strain: Not on file  . Food insecurity:    Worry: Not on file    Inability: Not on file  . Transportation needs:    Medical: Not on file    Non-medical: Not on file  Tobacco Use  . Smoking status: Never Smoker  Substance and Sexual Activity  . Alcohol use: No  . Drug use: No  . Sexual activity: Not on file  Lifestyle  . Physical activity:    Days per week: Not on file    Minutes per session: Not on file  . Stress: Not on file  Relationships  . Social connections:    Talks on phone: Not on file    Gets together: Not on file    Attends religious service: Not on file    Active member of club or organization: Not on file    Attends meetings of clubs  or organizations: Not on file    Relationship status: Not on file  Other Topics Concern  . Not on file  Social History Narrative  . Not on file     Family History: The patient's father had heart attack x2.  ROS:   Please see the history of present illness.     All other systems reviewed and are negative.  EKGs/Labs/Other Studies Reviewed:    The following studies were reviewed today: EKG, prior lab work office note reviewed  EKG:  EKG is rdered today.  The ekg ordered today demonstrates 04/24/2018-sinus rhythm 80 with no other significant abnormalities.  Recent Labs: No results found for requested labs within last 8760 hours.  Recent Lipid Panel No results found for: CHOL, TRIG, HDL, CHOLHDL, VLDL, LDLCALC, LDLDIRECT  Physical Exam:    VS:  BP 112/80   Pulse 80   Ht 5\' 8"   (1.727 m)   Wt 173 lb 3.2 oz (78.6 kg)   BMI 26.33 kg/m     Wt Readings from Last 3 Encounters:  04/24/18 173 lb 3.2 oz (78.6 kg)  12/02/14 146 lb (66.2 kg)  11/23/14 146 lb 14.4 oz (66.6 kg)     GEN:  Well nourished, well developed in no acute distress HEENT: Normal NECK: No JVD; No carotid bruits LYMPHATICS: No lymphadenopathy CARDIAC: RRR, no murmurs, rubs, gallops RESPIRATORY:  Clear to auscultation without rales, wheezing or rhonchi  ABDOMEN: Soft, non-tender, non-distended MUSCULOSKELETAL:  No edema; No deformity  SKIN: Warm and dry NEUROLOGIC:  Alert and oriented x 3 PSYCHIATRIC:  Normal affect   ASSESSMENT:    1. Chest pain, unspecified type   2. Family history of cardiovascular disease   3. Pre-procedure lab exam    PLAN:    In order of problems listed above:  Chest discomfort with family history of CAD - We will go ahead and perform a CT of coronary arteries with FFR.  He recalls that his father had a widow maker lesion and was not aware about this until he had his heart attack.  He wants to try his best to preventing heart disease.  Continue with diet, exercise as best as possible but he is limited somewhat by his disc herniation.  He does not smoke.  No diabetes.    Medication Adjustments/Labs and Tests Ordered: Current medicines are reviewed at length with the patient today.  Concerns regarding medicines are outlined above.  Orders Placed This Encounter  Procedures  . CT CORONARY MORPH W/CTA COR W/SCORE W/CA W/CM &/OR WO/CM  . CT CORONARY FRACTIONAL FLOW RESERVE DATA PREP  . CT CORONARY FRACTIONAL FLOW RESERVE FLUID ANALYSIS  . Basic metabolic panel  . EKG 12-Lead   Meds ordered this encounter  Medications  . metoprolol tartrate (LOPRESSOR) 50 MG tablet    Sig: Take 1 tablet (50 mg total) by mouth once for 1 dose. Take 1 tablet 1 hour before your CT scan    Dispense:  1 tablet    Refill:  0    Patient Instructions  Medication Instructions:    Continue medications as listed. Take Metoprolol tartrate 50 mg 1 hour before your CT scan.  Labwork: Please have blood work prior to your CT scan. (BMP)  Testing/Procedures: Your physician has requested that you have cardiac CT. Cardiac computed tomography (CT) is a painless test that uses an x-ray machine to take clear, detailed pictures of your heart. For further information please visit [facility]. Please follow instruction sheet as given.  Follow-Up:  Follow up as needed with Dr Marlou Porch after your testing.  Thank you for choosing [facility]!!        Signed, Candee Furbish, MD  04/24/2018 5:15 PM    [facility]

## 2018-04-24 NOTE — Patient Instructions (Signed)
Medication Instructions:  Continue medications as listed. Take Metoprolol tartrate 50 mg 1 hour before your CT scan.  Labwork: Please have blood work prior to your CT scan. (BMP)  Testing/Procedures: Your physician has requested that you have cardiac CT. Cardiac computed tomography (CT) is a painless test that uses an x-ray machine to take clear, detailed pictures of your heart. For further information please visit [facility]. Please follow instruction sheet as given.  Follow-Up: Follow up as needed with Dr Marlou Porch after your testing.  Thank you for choosing [facility]!!

## 2018-04-28 ENCOUNTER — Ambulatory Visit: Payer: BLUE CROSS/BLUE SHIELD | Admitting: Cardiology

## 2018-05-07 MED FILL — METOPROLOL TARTRATE 50 MG T: 50 | 1 days supply | Qty: 1 | Fill #0

## 2018-05-26 ENCOUNTER — Encounter (INDEPENDENT_AMBULATORY_CARE_PROVIDER_SITE_OTHER): Payer: Self-pay

## 2018-05-26 ENCOUNTER — Other Ambulatory Visit: Payer: 59 | Admitting: *Deleted

## 2018-05-26 DIAGNOSIS — Z01812 Encounter for preprocedural laboratory examination: Secondary | ICD-10-CM

## 2018-05-26 DIAGNOSIS — R079 Chest pain, unspecified: Secondary | ICD-10-CM

## 2018-05-26 LAB — BASIC METABOLIC PANEL
BUN / CREAT RATIO: 11 (ref 9–20)
BUN: 11 mg/dL (ref 6–24)
CHLORIDE: 102 mmol/L (ref 96–106)
CO2: 22 mmol/L (ref 20–29)
Calcium: 9.8 mg/dL (ref 8.7–10.2)
Creatinine, Ser: 1.03 mg/dL (ref 0.76–1.27)
GFR calc Af Amer: 96 mL/min/{1.73_m2} (ref >59)
GFR calc non Af Amer: 83 mL/min/{1.73_m2} (ref >59)
Glucose: 87 mg/dL (ref 65–99)
Potassium: 3.8 mmol/L (ref 3.5–5.2)
Sodium: 141 mmol/L (ref 134–144)

## 2018-05-27 ENCOUNTER — Ambulatory Visit (HOSPITAL_COMMUNITY)
Admission: RE | Admit: 2018-05-27 | Discharge: 2018-05-27 | Disposition: A | Discharge status: Home / Self Care | Payer: 59 | Source: Ambulatory Visit | Attending: Cardiology | Admitting: Cardiology

## 2018-05-27 ENCOUNTER — Encounter (HOSPITAL_COMMUNITY): Payer: Self-pay

## 2018-05-27 ENCOUNTER — Ambulatory Visit (HOSPITAL_COMMUNITY): Payer: 59

## 2018-05-27 DIAGNOSIS — I2510 Atherosclerotic heart disease of native coronary artery without angina pectoris: Secondary | ICD-10-CM | POA: Diagnosis not present

## 2018-05-27 DIAGNOSIS — Z01818 Encounter for other preprocedural examination: Secondary | ICD-10-CM | POA: Insufficient documentation

## 2018-05-27 DIAGNOSIS — Z8249 Family history of ischemic heart disease and other diseases of the circulatory system: Secondary | ICD-10-CM | POA: Diagnosis not present

## 2018-05-27 DIAGNOSIS — Z01812 Encounter for preprocedural laboratory examination: Secondary | ICD-10-CM

## 2018-05-27 DIAGNOSIS — R079 Chest pain, unspecified: Secondary | ICD-10-CM | POA: Diagnosis not present

## 2018-05-27 MED ORDER — METOPROLOL TARTRATE 5 MG/5ML IV SOLN
INTRAVENOUS | Status: AC
  Administered 2018-05-27: 5 mg via INTRAVENOUS
  Filled 2018-05-27: qty 15

## 2018-05-27 MED ORDER — IOPAMIDOL (ISOVUE-370) INJECTION 76%
100.0000 mL | Freq: Once | INTRAVENOUS | Status: AC | PRN
  Administered 2018-05-27: 80 mL via INTRAVENOUS

## 2018-05-27 MED ORDER — METOPROLOL TARTRATE 5 MG/5ML IV SOLN
5.0000 mg | INTRAVENOUS | Status: DC | PRN
  Administered 2018-05-27: 5 mg via INTRAVENOUS
  Filled 2018-05-27: qty 5

## 2018-05-27 MED ORDER — NITROGLYCERIN 0.4 MG SL SUBL
0.8000 mg | SUBLINGUAL_TABLET | Freq: Once | SUBLINGUAL | Status: AC
  Administered 2018-05-27: 0.8 mg via SUBLINGUAL
  Filled 2018-05-27: qty 25

## 2018-05-27 MED ORDER — NITROGLYCERIN 0.4 MG SL SUBL
SUBLINGUAL_TABLET | SUBLINGUAL | Status: AC
  Administered 2018-05-27: 0.8 mg via SUBLINGUAL
  Filled 2018-05-27: qty 2

## 2018-05-28 ENCOUNTER — Telehealth: Payer: Self-pay

## 2018-05-28 NOTE — Telephone Encounter (Signed)
Notes recorded by Frederik Schmidt, RN on 05/28/2018 at 3:29 PM EDT Informed patient of results/recommendations. He verbalized understanding. ------

## 2018-05-28 NOTE — Telephone Encounter (Signed)
-----   Message from Jerline Pain, MD sent at 05/28/2018  3:20 PM EDT ----- [place] for CT Candee Furbish, MD

## 2018-05-29 ENCOUNTER — Telehealth: Payer: Self-pay | Admitting: Cardiology

## 2018-05-29 DIAGNOSIS — E785 Hyperlipidemia, unspecified: Secondary | ICD-10-CM

## 2018-05-29 NOTE — Telephone Encounter (Signed)
Please see my result note. Suezanne Jacquet tried to call with my recs and left a message.  Candee Furbish, MD

## 2018-05-29 NOTE — Telephone Encounter (Signed)
Spoke to pt and advised him that his CT results were in but Dr. Marlou Porch still reviewing and will call him as soon as we hear back from Dr. Marlou Porch.. Pt verbalized understanding.

## 2018-05-29 NOTE — Telephone Encounter (Signed)
Patient calling for CT results. 

## 2018-05-30 MED ORDER — ATORVASTATIN CALCIUM 40 MG PO TABS: 40.0000 mg | ORAL_TABLET | Freq: Every day | ORAL | 3 refills | Status: DC

## 2018-05-30 NOTE — Telephone Encounter (Signed)
Spoke with pt today and discussed CT results and recommendations. Pt is agreeable to starting atorvastatin 40mg  qd. He does have some concerns with potential side effects. I suggested to him to take his medication at night to help lessen the chance of potential side effects. If he begins to have side effects, please call the office back and Dr Marlou Porch could make some medication adjustments. Pt will also come to the office on Dec 3 for a hepatic panel blood draw. Pt has verbalized understanding and had no additional questions.

## 2018-05-30 NOTE — Telephone Encounter (Signed)
Follow Up:     Pt calling for CT results.

## 2018-05-30 NOTE — Telephone Encounter (Signed)
-----   Message from Jerline Pain, MD sent at 05/28/2018  4:41 PM EDT ----- Coronary calcium score is 2, 54th percentile for age.  Minimal nonobstructive coronary artery disease noted, minimal diffuse plaque of less than 25%. Recommendation is to start atorvastatin 40 mg once a day to help stabilize plaque and assist in prevention. Repeat lipid panel and ALT in 2 months.

## 2018-07-15 DIAGNOSIS — K769 Liver disease, unspecified: Secondary | ICD-10-CM | POA: Diagnosis not present

## 2018-07-15 DIAGNOSIS — K7689 Other specified diseases of liver: Secondary | ICD-10-CM | POA: Diagnosis not present

## 2018-07-22 DIAGNOSIS — R109 Unspecified abdominal pain: Secondary | ICD-10-CM | POA: Diagnosis not present

## 2018-07-22 DIAGNOSIS — K769 Liver disease, unspecified: Secondary | ICD-10-CM | POA: Diagnosis not present

## 2018-08-05 ENCOUNTER — Other Ambulatory Visit: Payer: 59

## 2018-09-16 DIAGNOSIS — Z6825 Body mass index (BMI) 25.0-25.9, adult: Secondary | ICD-10-CM | POA: Diagnosis not present

## 2018-09-16 DIAGNOSIS — Z131 Encounter for screening for diabetes mellitus: Secondary | ICD-10-CM | POA: Diagnosis not present

## 2018-09-16 DIAGNOSIS — E785 Hyperlipidemia, unspecified: Secondary | ICD-10-CM | POA: Diagnosis not present

## 2018-09-16 DIAGNOSIS — Z23 Encounter for immunization: Secondary | ICD-10-CM | POA: Diagnosis not present

## 2018-09-16 DIAGNOSIS — K219 Gastro-esophageal reflux disease without esophagitis: Secondary | ICD-10-CM | POA: Diagnosis not present

## 2018-09-16 DIAGNOSIS — Z0000 Encounter for general adult medical examination without abnormal findings: Secondary | ICD-10-CM | POA: Diagnosis not present

## 2018-09-16 DIAGNOSIS — Z125 Encounter for screening for malignant neoplasm of prostate: Secondary | ICD-10-CM | POA: Diagnosis not present

## 2018-09-16 DIAGNOSIS — Z79899 Other long term (current) drug therapy: Secondary | ICD-10-CM | POA: Diagnosis not present

## 2018-09-16 DIAGNOSIS — Z1331 Encounter for screening for depression: Secondary | ICD-10-CM | POA: Diagnosis not present

## 2018-09-25 MED FILL — ROSUVASTATIN CALCIUM 10 MG: 10 | 90 days supply | Qty: 45 | Fill #0

## 2019-02-17 MED FILL — ROSUVASTATIN CALCIUM 5 MG T: 5 | 90 days supply | Qty: 90 | Fill #0

## 2019-05-07 DIAGNOSIS — H524 Presbyopia: Secondary | ICD-10-CM | POA: Diagnosis not present

## 2019-05-07 DIAGNOSIS — H5213 Myopia, bilateral: Secondary | ICD-10-CM | POA: Diagnosis not present

## 2019-05-13 ENCOUNTER — Encounter: Payer: Self-pay | Admitting: Gastroenterology

## 2019-05-13 NOTE — Telephone Encounter (Signed)
Error

## 2019-05-28 ENCOUNTER — Ambulatory Visit: Payer: 59 | Admitting: Gastroenterology

## 2019-06-04 ENCOUNTER — Other Ambulatory Visit: Payer: Self-pay

## 2019-06-04 ENCOUNTER — Encounter: Payer: Self-pay | Admitting: Gastroenterology

## 2019-06-04 ENCOUNTER — Ambulatory Visit (INDEPENDENT_AMBULATORY_CARE_PROVIDER_SITE_OTHER): Payer: 59 | Admitting: Gastroenterology

## 2019-06-04 VITALS — BP 120/86 | HR 75 | Temp 97.9°F | Ht 68.0 in | Wt 174.2 lb

## 2019-06-04 DIAGNOSIS — K219 Gastro-esophageal reflux disease without esophagitis: Secondary | ICD-10-CM | POA: Diagnosis not present

## 2019-06-04 DIAGNOSIS — R1310 Dysphagia, unspecified: Secondary | ICD-10-CM | POA: Diagnosis not present

## 2019-06-04 MED ORDER — DEXLANSOPRAZOLE 60 MG PO CPDR: 60.0000 mg | DELAYED_RELEASE_CAPSULE | Freq: Every day | ORAL | 11 refills | Status: AC

## 2019-06-04 NOTE — Patient Instructions (Signed)
If you are age 53 or older, your body mass index should be between 23-30. Your Body mass index is 26.49 kg/m. If this is out of the aforementioned range listed, please consider follow up with your Primary Care Provider.  If you are age 14 or younger, your body mass index should be between 19-25. Your Body mass index is 26.49 kg/m. If this is out of the aformentioned range listed, please consider follow up with your Primary Care Provider.    We have sent the following medications to your pharmacy for you to pick up at your convenience: Dexilant 60 mg   It has been recommended to you by your physician that you have a(n) EGD completed. Per your request, we did not schedule the procedure(s) today. Please contact our office at [phone] should you decide to have the procedure completed. You will be scheduled for a pre-visit and procedure at that time.   Thank you,  Dr. Jackquline Denmark

## 2019-06-04 NOTE — Progress Notes (Signed)
Chief Complaint:   Referring Provider:  Helen Hashimoto., MD      ASSESSMENT AND PLAN;   #1. GERD with small HH and eso dysphagia.  Previous H/O Schatzki's ring S/P dilatation with good relief. Neg eso bx for EoE.  #2. H/O TAs.  Next colonoscopy due 11/2019  Plan: - Dexilant 60mg  po qd #30, 11 - EGD with dil.  I have explained risks and benefits.  He will schedule it in coming days. - For now chew food specially meats and breads well and eat slowly. - Colon next year 11/2019.   HPI:    Aaron Simon is a 53 y.o. 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  This time he decided to come early.  Denies having any problems with liquids.  Mainly in the mid chest.  He does have some regurgitation after eating lunch especially when he bends.  Denies having any diarrhea, constipation, fever chills, weight loss or any significant abdominal pain.  Past GI procedures: -EGD 09/2011 Schatzki's ring S/P dilatation 50 AFR, small hiatal hernia, neg Bx for EoE, neg SB bx for celiac. -Colonoscopy 11/2012- small TA  Past Medical History:  Diagnosis Date  . Anal fissure   . Anxiety   . Back pain   . Complication of anesthesia    Mental "goofy".  Sats dropped in PACU. "Does not take much"  . Dysphagia   . GERD (gastroesophageal reflux disease)   . H. pylori infection   . History of colon polyps   . Internal hemorrhoid     Past Surgical History:  Procedure Laterality Date  . APPENDECTOMY    . COLONOSCOPY  11/07/2012   Diminutive colonic polyp status post polypectomy. Small internal hemorrhoids. Posterior annal fissure (the most likely eithology of bright red blood per rectum)  . ESOPHAGOGASTRODUODENOSCOPY  09/10/2011   Schatzki ring status post esophageal dilatation. Small hiatal hernia.   Marland Kitchen LUMBAR FUSION  12/02/2014  . LUMBAR LAMINECTOMY/DECOMPRESSION MICRODISCECTOMY  04/17/2012   Procedure: LUMBAR LAMINECTOMY/DECOMPRESSION MICRODISCECTOMY;  Surgeon: Sinclair Ship, MD;  Location: [place];  Service: Orthopedics;  Laterality: Right;  Right sided lumbar 5-sacrum 1 microdisectomy    Family History  Problem Relation Age of Onset  . Colon cancer Maternal Grandfather   . Colon cancer Maternal Great-grandfather   . Esophageal cancer Neg Hx     Social History   Tobacco Use  . Smoking status: Never Smoker  . Smokeless tobacco: Never Used  Substance Use Topics  . Alcohol use: No  . Drug use: No    Current Outpatient Medications  Medication Sig Dispense Refill  . dexlansoprazole (DEXILANT) 60 MG capsule Take 60 mg by mouth as needed (2 times a week).    . famotidine (PEPCID) 40 MG tablet Take 40 mg by mouth at bedtime.     Marland Kitchen atorvastatin (LIPITOR) 10 MG tablet Take 10 mg by mouth daily as needed.     No current facility-administered medications for this visit.     No Known Allergies  Review of Systems:  Constitutional: Denies fever, chills, diaphoresis, appetite change and fatigue.  HEENT: Denies photophobia, eye pain, redness, hearing loss, ear pain, congestion, sore throat, rhinorrhea, sneezing, mouth sores, neck pain, neck stiffness and tinnitus.   Respiratory: Denies SOB, DOE, cough, chest tightness,  and wheezing.   Cardiovascular: Denies chest pain, palpitations and leg swelling.  Genitourinary: Denies dysuria, urgency,  frequency, hematuria, flank pain and difficulty urinating.  Musculoskeletal: Denies myalgias, back pain, joint swelling, arthralgias and gait problem.  Skin: No rash.  Neurological: Denies dizziness, seizures, syncope, weakness, light-headedness, numbness and headaches.  Hematological: Denies adenopathy. Easy bruising, personal or family bleeding history  Psychiatric/Behavioral: No anxiety or depression     Physical Exam:    BP 120/86   Pulse 75   Temp 97.9 F (36.6 C)   Ht 5\' 8"  (1.727 m)   Wt 174 lb 4 oz (79 kg)   BMI  26.49 kg/m  Filed Weights   06/04/19 0845  Weight: 174 lb 4 oz (79 kg)   Constitutional:  Well-developed, in no acute distress. Psychiatric: Normal mood and affect. Behavior is normal. HEENT: Pupils normal.  Conjunctivae are normal. No scleral icterus. Neck supple.  Cardiovascular: Normal rate, regular rhythm. No edema Pulmonary/chest: Effort normal and breath sounds normal. No wheezing, rales or rhonchi. Abdominal: Soft, nondistended. Nontender. Bowel sounds active throughout. There are no masses palpable. No hepatomegaly. Rectal:  defered Neurological: Alert and oriented to person place and time. Skin: Skin is warm and dry. No rashes noted.  Data Reviewed: I have personally reviewed following labs and imaging studies  CBC: CBC Latest Ref Rng & Units 11/23/2014 04/14/2012  WBC 4.0 - 10.5 K/uL 5.8 6.7  Hemoglobin 13.0 - 17.0 g/dL 15.9 15.8  Hematocrit 39.0 - 52.0 % 45.4 44.9  Platelets 150 - 400 K/uL 279 263    CMP: CMP Latest Ref Rng & Units 05/26/2018 11/23/2014 04/14/2012  Glucose 65 - 99 mg/dL 87 110(H) 82  BUN 6 - 24 mg/dL 11 6 14   Creatinine 0.76 - 1.27 mg/dL 1.03 1.00 0.89  Sodium 134 - 144 mmol/L 141 139 139  Potassium 3.5 - 5.2 mmol/L 3.8 3.7 3.9  Chloride 96 - 106 mmol/L 102 101 102  CO2 20 - 29 mmol/L 22 27 29   Calcium 8.7 - 10.2 mg/dL 9.8 9.7 10.0  Total Protein 6.0 - 8.3 g/dL - 6.7 7.2  Total Bilirubin 0.3 - 1.2 mg/dL - 1.0 0.9  Alkaline Phos 39 - 117 U/L - 47 42  AST 0 - 37 U/L - 26 24  ALT 0 - 53 U/L - 38 39     Carmell Austria, MD 06/04/2019, 9:00 AM  Cc: Helen Hashimoto., MD

## 2019-06-11 ENCOUNTER — Other Ambulatory Visit: Payer: Self-pay

## 2019-06-11 DIAGNOSIS — Z1159 Encounter for screening for other viral diseases: Secondary | ICD-10-CM

## 2019-06-17 ENCOUNTER — Telehealth: Payer: Self-pay

## 2019-06-17 NOTE — Telephone Encounter (Signed)
Covid-19 screening questions   Do you now or have you had a fever in the last 14 days? NO   Do you have any respiratory symptoms of shortness of breath or cough now or in the last 14 days? NO  Do you have any family members or close contacts with diagnosed or suspected Covid-19 in the past 14 days? NO  Have you been tested for Covid-19 and found to be positive? NO        

## 2019-06-18 ENCOUNTER — Other Ambulatory Visit: Payer: Self-pay

## 2019-06-18 ENCOUNTER — Ambulatory Visit (AMBULATORY_SURGERY_CENTER): Payer: 59 | Admitting: Gastroenterology

## 2019-06-18 ENCOUNTER — Other Ambulatory Visit: Payer: Self-pay | Admitting: Gastroenterology

## 2019-06-18 ENCOUNTER — Encounter: Payer: Self-pay | Admitting: Gastroenterology

## 2019-06-18 VITALS — BP 105/75 | HR 65 | Temp 98.3°F | Resp 22 | Ht 68.0 in | Wt 174.0 lb

## 2019-06-18 DIAGNOSIS — K317 Polyp of stomach and duodenum: Secondary | ICD-10-CM | POA: Diagnosis not present

## 2019-06-18 DIAGNOSIS — K222 Esophageal obstruction: Secondary | ICD-10-CM | POA: Diagnosis not present

## 2019-06-18 DIAGNOSIS — K2950 Unspecified chronic gastritis without bleeding: Secondary | ICD-10-CM

## 2019-06-18 DIAGNOSIS — K449 Diaphragmatic hernia without obstruction or gangrene: Secondary | ICD-10-CM

## 2019-06-18 DIAGNOSIS — R1310 Dysphagia, unspecified: Secondary | ICD-10-CM | POA: Diagnosis not present

## 2019-06-18 DIAGNOSIS — K2970 Gastritis, unspecified, without bleeding: Secondary | ICD-10-CM

## 2019-06-18 DIAGNOSIS — K2990 Gastroduodenitis, unspecified, without bleeding: Secondary | ICD-10-CM

## 2019-06-18 DIAGNOSIS — K3189 Other diseases of stomach and duodenum: Secondary | ICD-10-CM

## 2019-06-18 DIAGNOSIS — K2100 Gastro-esophageal reflux disease with esophagitis, without bleeding: Secondary | ICD-10-CM | POA: Diagnosis not present

## 2019-06-18 DIAGNOSIS — K259 Gastric ulcer, unspecified as acute or chronic, without hemorrhage or perforation: Secondary | ICD-10-CM | POA: Diagnosis not present

## 2019-06-18 MED ORDER — SODIUM CHLORIDE 0.9 % IV SOLN: 500.0000 mL | Freq: Once | INTRAVENOUS | Status: DC

## 2019-06-18 MED FILL — DEXILANT DR 60 MG CAPSULE: 60 | 30 days supply | Qty: 30 | Fill #0

## 2019-06-18 NOTE — Progress Notes (Signed)
PT taken to PACU. Monitors in place. VSS. Report given to RN. 

## 2019-06-18 NOTE — Patient Instructions (Signed)
HANDOUTS PROVIDED ON:  POST ESOPHAGEAL DILATION DIET & GASTRITIS  YOU WILL BE REQUIRED TO FOLLOW A SPECIAL DIET FOR THE REST OF TODAY.  PLEASE CONTINUE TAKING DEXILANT.  NO IBUPROFEN, NAPROXEN, OR NSAIDS.  PATHOLOGY RESULTS FOR THE BIOPSIES TAKEN TODAY WILL TAKE 10-14 DAYS TO RECEIVE.  RETURN FOR A FOLLOW-UP IN 12 WEEKS.  YOU HAD AN ENDOSCOPIC PROCEDURE TODAY AT [facility]:   Refer to the procedure report that was given to you for any specific questions about what was found during the examination.  If the procedure report does not answer your questions, please call your gastroenterologist to clarify.  If you requested that your care partner not be given the details of your procedure findings, then the procedure report has been included in a sealed envelope for you to review at your convenience later.  YOU SHOULD EXPECT: Some feelings of bloating in the abdomen. Passage of more gas than usual.  Walking can help get rid of the air that was put into your GI tract during the procedure and reduce the bloating. If you had a lower endoscopy (such as a colonoscopy or flexible sigmoidoscopy) you may notice spotting of blood in your stool or on the toilet paper. If you underwent a bowel prep for your procedure, you may not have a normal bowel movement for a few days.  Please Note:  You might notice some irritation and congestion in your nose or some drainage.  This is from the oxygen used during your procedure.  There is no need for concern and it should clear up in a day or so.  SYMPTOMS TO REPORT IMMEDIATELY:   Following upper endoscopy (EGD)  Vomiting of blood or coffee ground material  New chest pain or pain under the shoulder blades  Painful or persistently difficult swallowing  New shortness of breath  Fever of 100F or higher  Black, tarry-looking stools  For urgent or emergent issues, a gastroenterologist can be reached at any hour by calling [phone].   DIET:   POST DILATION DIET (FOLLOW HANDOUT)  ACTIVITY:  You should plan to take it easy for the rest of today and you should NOT DRIVE or use heavy machinery until tomorrow (because of the sedation medicines used during the test).    FOLLOW UP: Our staff will call the number listed on your records 48-72 hours following your procedure to check on you and address any questions or concerns that you may have regarding the information given to you following your procedure. If we do not reach you, we will leave a message.  We will attempt to reach you two times.  During this call, we will ask if you have developed any symptoms of COVID 19. If you develop any symptoms (ie: fever, flu-like symptoms, shortness of breath, cough etc.) before then, please call [phone].  If you test positive for Covid 19 in the 2 weeks post procedure, please call and report this information to [place].    If any biopsies were taken you will be contacted by phone or by letter within the next 1-3 weeks.  Please call us at [phone] if you have not heard about the biopsies in 3 weeks.    SIGNATURES/CONFIDENTIALITY: You and/or your care partner have signed paperwork which will be entered into your electronic medical record.  These signatures attest to the fact that that the information above on your After Visit Summary has been reviewed and is understood.  Full responsibility of the confidentiality  of this discharge information lies with you and/or your care-partner.

## 2019-06-18 NOTE — Op Note (Signed)
[address] Patient Name: Aaron Simon Procedure Date: 06/18/2019 1:44 PM MRN: [id number] Endoscopist: Jackquline Denmark , MD Age: 53 Referring MD:  Date of Birth: 1966/04/05 Gender: Male Account #: [id number] Procedure:                Upper GI endoscopy Indications:              Dysphagia Medicines:                Monitored Anesthesia Care Procedure:                Pre-Anesthesia Assessment:                           - Prior to the procedure, a History and Physical                            was performed, and patient medications and                            allergies were reviewed. The patient's tolerance of                            previous anesthesia was also reviewed. The risks                            and benefits of the procedure and the sedation                            options and risks were discussed with the patient.                            All questions were answered, and informed consent                            was obtained. Prior Anticoagulants: The patient has                            taken no previous anticoagulant or antiplatelet                            agents. ASA Grade Assessment: II - A patient with                            mild systemic disease. After reviewing the risks                            and benefits, the patient was deemed in                            satisfactory condition to undergo the procedure.                           After obtaining informed consent, the endoscope was  passed under direct vision. Throughout the                            procedure, the patient's blood pressure, pulse, and                            oxygen saturations were monitored continuously. The                            Endoscope was introduced through the mouth, and                            advanced to the second part of duodenum. The upper                            GI endoscopy was accomplished without difficulty.                             The patient tolerated the procedure well. Scope In: Scope Out: Findings:                 The esophagus was mildly tortuous in the distal 1/3                            rd but normal. Schatzki ring was found at the                            gastroesophageal junction, 40 cm from incisors,                            with luminal diameter of appox 14 mm. Bx were                            obtained from the proximal and distal esophagus                            with cold forceps for histology to r/o EoE. The                            scope was withdrawn. Dilation was performed with a                            Maloney dilator with mild resistance at 50 Fr and                            52 Fr.                           A 2 cm hiatal hernia was present.                           Localized moderate inflammation characterized by  erythema and few erosions was present in gastric                            antrum. Biopsies were taken with a cold forceps for                            histology.                           Two 4 to 6 mm sessile polyps with no bleeding and                            no stigmata of recent bleeding were found in the                            gastric body. These were removed with a cold biopsy                            forceps. Resection and retrieval were complete.                           The examined duodenum was normal. Complications:            No immediate complications. Estimated Blood Loss:     Estimated blood loss: none. Impression:               - Schatzki ring s/p esophageal dilatation.                           - Small hiatal hernia.                           - Moderate gastritis with few gastric erosions.                           - Two incidental gastric polyps. Resected and                            retrieved. Recommendation:           - Patient has a contact number available for                             emergencies. The signs and symptoms of potential                            delayed complications were discussed with the                            patient. Return to normal activities tomorrow.                            Written discharge instructions were provided to the                            patient.                           -  Post dilatation diet.                           - Continue Dexilant 60 mg p.o. QD.                           - Await pathology results.                           - No ibuprofen, naproxen, or other non-steroidal                            anti-inflammatory drugs.                           - Return to GI clinic in 12 weeks.                           - D/W Haze Boyden, MD 06/18/2019 2:17:05 PM This report has been signed electronically.

## 2019-06-18 NOTE — Progress Notes (Signed)
Called to room to assist during endoscopic procedure.  Patient ID and intended procedure confirmed with present staff. Received instructions for my participation in the procedure from the performing physician.  

## 2019-06-22 ENCOUNTER — Telehealth: Payer: Self-pay

## 2019-06-22 NOTE — Telephone Encounter (Signed)
Left message on follow up call. 

## 2019-06-22 NOTE — Telephone Encounter (Signed)
  Follow up Call-  Call back number 06/18/2019  Post procedure Call Back phone  # [phone]  Permission to leave phone message Yes  Some recent data might be hidden     Patient questions:  Do you have a fever, pain , or abdominal swelling? No. Pain Score  0 *  Have you tolerated food without any problems? Yes.    Have you been able to return to your normal activities? Yes.    Do you have any questions about your discharge instructions: Diet   No. Medications  No. Follow up visit  No.  Do you have questions or concerns about your Care? No.  Actions: * If pain score is 4 or above: No action needed, pain <4. 1. Have you developed a fever since your procedure? no  2.   Have you had an respiratory symptoms (SOB or cough) since your procedure? no  3.   Have you tested positive for COVID 19 since your procedure no  4.   Have you had any family members/close contacts diagnosed with the COVID 19 since your procedure?  no   If yes to any of these questions please route to Joylene John, RN and Alphonsa Gin, Therapist, sports.

## 2019-06-29 ENCOUNTER — Encounter: Payer: Self-pay | Admitting: Gastroenterology

## 2020-02-26 IMAGING — CT CT HEART MORP W/ CTA COR W/ SCORE W/ CA W/CM &/OR W/O CM
4 of 7 series · 8 of 20 positions shown, 9 images · non-contrast
Comparison: None.

CLINICAL DATA: 52-year-old male with family history of early CAD
and chest pain.

EXAM:
Cardiac/Coronary  CT
TECHNIQUE: The patient was scanned on a Phillips Force scanner.

[Series 6: best diast 78 % · axial · 0.34mm/px · z∈[+1159,+1200]mm · 2 of 308 slices shown, 3 images]
[im 103/308  vessel]
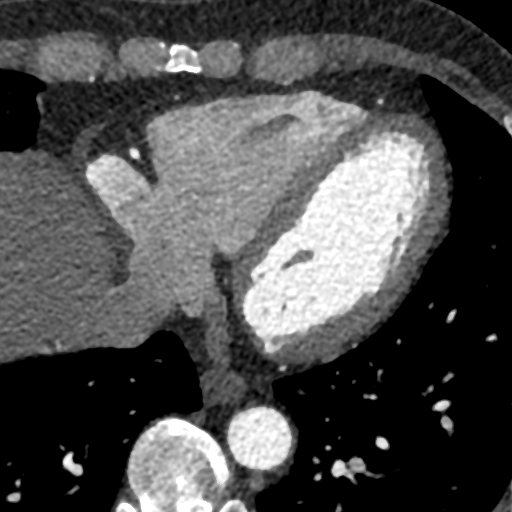
[im 103/308  lung]
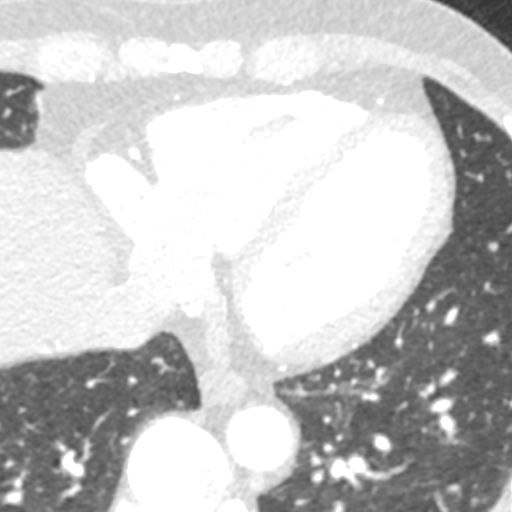
[im 205/308  vessel]
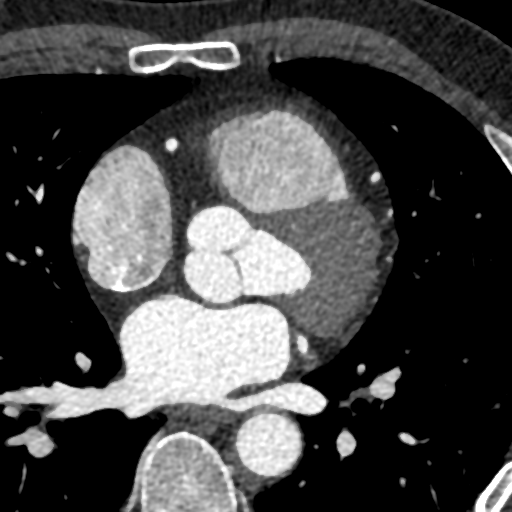

[Series 7: best syst 34 % · axial · 0.34mm/px · z∈[+1159,+1200]mm · 2 of 308 slices shown]
[im 103/308  vessel]
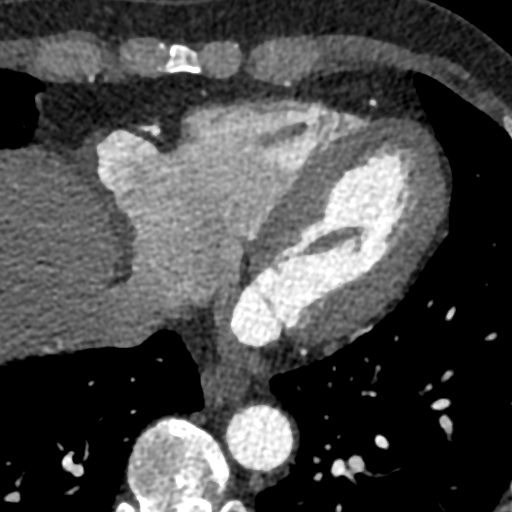
[im 205/308  vessel]
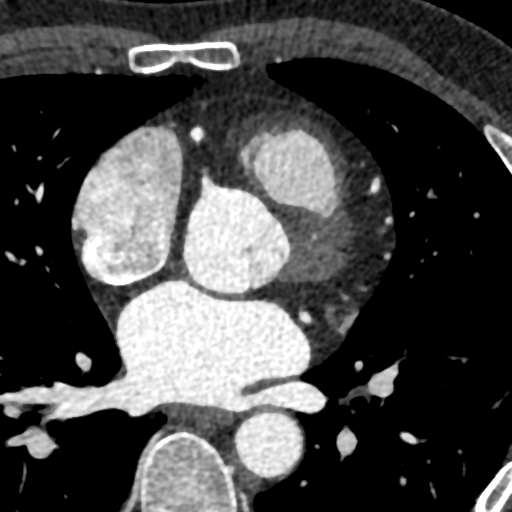

[Series 8: ts diast sharp 78 % · axial · 0.34mm/px · z∈[+1159,+1200]mm · 2 of 308 slices shown]
[im 103/308  lung]
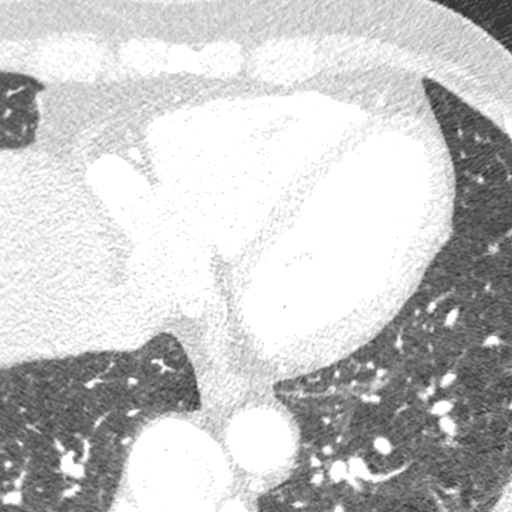
[im 205/308  lung]
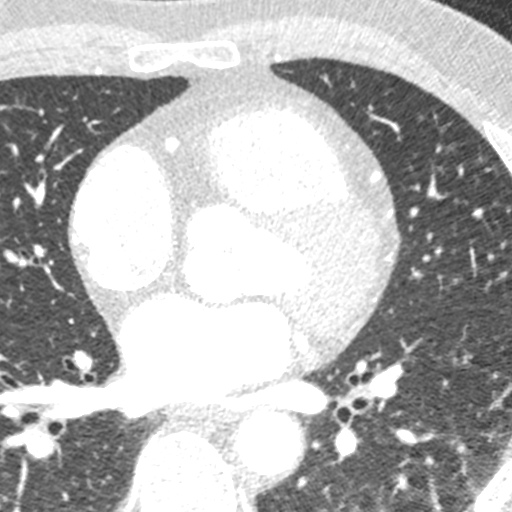

[Series 9: ts syst sharp 34 % · axial · 0.34mm/px · z∈[+1159,+1200]mm · 2 of 308 slices shown]
[im 103/308  lung]
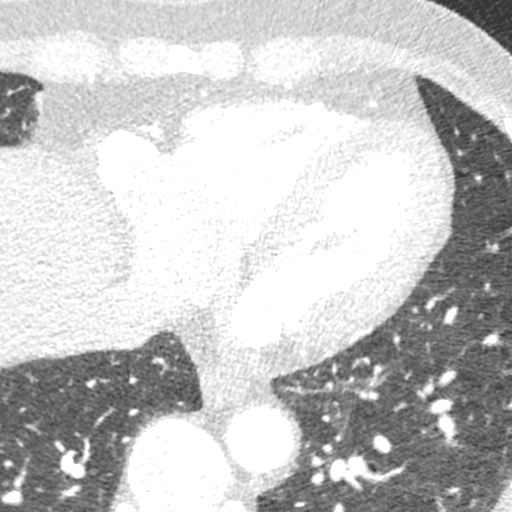
[im 205/308  lung]
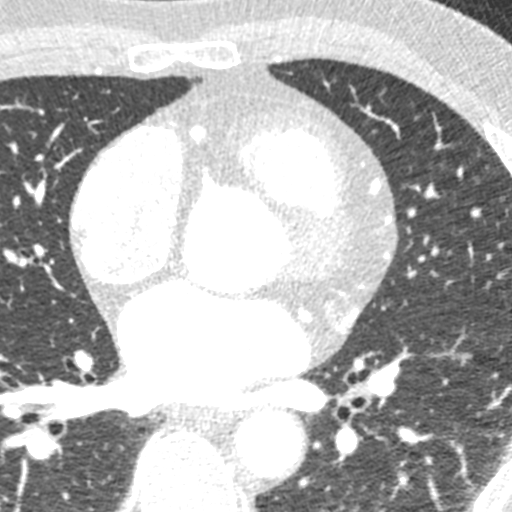

[8 of 20 positions shown; findings below may reference images not displayed]



Aorta:  Normal size.  No calcifications.  No dissection.

Aortic Valve:  Trileaflet.  No calcifications.

Coronary Arteries:  Normal coronary origin.  Right dominance.

RCA is a large dominant artery that gives rise to PDA and PLVB.
There is minimal diffuse plaque with stenosis < 25%.

Left main is a large artery that gives rise to LAD, a very small
ramus intermedius and LCX arteries. Left main has no plaque.

LAD is a large vessel that gives rise to one diagonal artery and has
minimal diffuse non-calcified with stenosis < 25%

LCX is a non-dominant artery that gives rise to one large OM1
branch. There is no plaque.

Other findings:

Normal pulmonary vein drainage into the left atrium.

Normal let atrial appendage without a thrombus.

Normal size of the pulmonary artery.
IMPRESSION: 1. Coronary calcium score of 2. This was 54 percentile for age and
sex matched control.

2. Normal coronary origin with right dominance.

3.  Minimal non-obstructive CAD.

EXAM:
OVER-READ INTERPRETATION  CT CHEST

The following report is an over-read performed by radiologist Dr.
Unjam Ligato [[email or web address]] on 05/27/2018. This
over-read does not include interpretation of cardiac or coronary
anatomy or pathology. The coronary calcium score/coronary CTA
interpretation by the cardiologist is attached.
FINDINGS: Within the visualized portions of the thorax there are no suspicious
appearing pulmonary nodules or masses, there is no acute
consolidative airspace disease, no pleural effusions, no
pneumothorax and no lymphadenopathy. Visualized portions of the
upper abdomen demonstrates some low-attenuation lesions in the liver
which are incompletely visualized and not characterized on today's
examination, but favored to represent cysts, largest of which
measures at least 5.0 x 2.5 cm in segment 4A. There are no
aggressive appearing lytic or blastic lesions noted in the
visualized portions of the skeleton.
IMPRESSION: Low-attenuation liver lesions, likely to represent cysts
incidentally noted.

## 2022-02-02 ENCOUNTER — Telehealth: Payer: Self-pay | Admitting: Gastroenterology

## 2022-02-02 NOTE — Telephone Encounter (Signed)
Good Morning Dr. Lyndel Safe,  Patient called stating he wanted to know when he was next due for his colonoscopy. Looking at patients chart I did not see a recall. Patient stated he had his last procedure with you in [place]. Will you please advise with me on scheduling patient?  Thank you.

## 2022-02-05 NOTE — Telephone Encounter (Signed)
Brooke, Can you please print out his last colonoscopy report and give it to me. RG   I cannot access [facility] remotely.

## 2022-02-06 NOTE — Telephone Encounter (Signed)
Last Colon 11-07-2012 according to [facility]. It is on your desk

## 2022-02-07 NOTE — Telephone Encounter (Signed)
Scheduled 6-9 with nurse and 6-28 for procedure with patient and he has address and voiced understanding

## 2022-02-07 NOTE — Telephone Encounter (Signed)
Colonoscopy 11/07/2012 ([facility]) -Small colonic polyp s/p polypectomy. Bx- TA -Small internal hemorrhoids -Posterior anal fissure  Rpt 5 yrs - as per recommendations at that time  Plan: -Proceed with colonoscopy directly at [facility]

## 2022-02-09 ENCOUNTER — Ambulatory Visit (AMBULATORY_SURGERY_CENTER): Payer: Commercial Managed Care - PPO | Admitting: *Deleted

## 2022-02-09 VITALS — Ht 68.0 in | Wt 184.0 lb

## 2022-02-09 DIAGNOSIS — Z86010 Personal history of colonic polyps: Secondary | ICD-10-CM

## 2022-02-09 MED ORDER — NA SULFATE-K SULFATE-MG SULF 17.5-3.13-1.6 GM/177ML PO SOLN: 2.0000 | Freq: Once | ORAL | 0 refills | Status: AC

## 2022-02-09 NOTE — Progress Notes (Signed)
No egg or soy allergy known to patient  No issues known to pt with past sedation with any surgeries or procedures Patient denies ever being told they had issues or difficulty with intubation  No FH of Malignant Hyperthermia Pt is not on diet pills Pt is not on  home 02  Pt is not on blood thinners  Pt denies issues with constipation  No A fib or A flutter   PV completed in person. Pt verified name, DOB, address and insurance during PV today.  Pt given instruction packet with consent form to read and sign after procedure and instructions explained and questions answered. Pt encouraged to call with questions or issues.  If pt has My chart, procedure instructions sent via My Chart

## 2022-02-13 ENCOUNTER — Telehealth: Payer: Self-pay | Admitting: Gastroenterology

## 2022-02-13 ENCOUNTER — Other Ambulatory Visit (HOSPITAL_COMMUNITY): Payer: Self-pay

## 2022-02-13 DIAGNOSIS — Z86010 Personal history of colonic polyps: Secondary | ICD-10-CM

## 2022-02-13 MED ORDER — NA SULFATE-K SULFATE-MG SULF 17.5-3.13-1.6 GM/177ML PO SOLN
1.0000 | Freq: Once | ORAL | 0 refills | Status: AC
  Filled 2022-02-13: qty 354, 1d supply, fill #0

## 2022-02-13 NOTE — Telephone Encounter (Signed)
Inbound call from patient stating that insurance will not cover his prep medication for his procedure on 6/28. Patient is seeking advice on other alternatives. Please advise.

## 2022-02-13 NOTE — Telephone Encounter (Signed)
Aaron Simon is no charge at [employer] however the prep was sent to [facility] - called pt-  he will come to [facility] and get the prep

## 2022-02-14 ENCOUNTER — Other Ambulatory Visit (HOSPITAL_COMMUNITY): Payer: Self-pay

## 2022-02-26 ENCOUNTER — Encounter: Payer: Self-pay | Admitting: Gastroenterology

## 2022-02-28 ENCOUNTER — Ambulatory Visit (AMBULATORY_SURGERY_CENTER): Payer: Commercial Managed Care - PPO | Admitting: Gastroenterology

## 2022-02-28 ENCOUNTER — Encounter: Payer: Self-pay | Admitting: Gastroenterology

## 2022-02-28 VITALS — BP 95/74 | HR 61 | Temp 97.1°F | Resp 17 | Ht 68.0 in | Wt 184.0 lb

## 2022-02-28 DIAGNOSIS — D124 Benign neoplasm of descending colon: Secondary | ICD-10-CM | POA: Diagnosis not present

## 2022-02-28 DIAGNOSIS — Z09 Encounter for follow-up examination after completed treatment for conditions other than malignant neoplasm: Secondary | ICD-10-CM | POA: Diagnosis not present

## 2022-02-28 DIAGNOSIS — Z86010 Personal history of colonic polyps: Secondary | ICD-10-CM

## 2022-02-28 MED ORDER — SODIUM CHLORIDE 0.9 % IV SOLN: 500.0000 mL | Freq: Once | INTRAVENOUS | Status: DC

## 2022-02-28 NOTE — Progress Notes (Signed)
VS completed by CW.   Pt's states no medical or surgical changes since previsit or office visit.  

## 2022-02-28 NOTE — Progress Notes (Signed)
[facility] History and Physical   Primary Care Physician:  Helen Hashimoto., MD   Reason for Procedure:   History of polyps  Plan:     colonoscopy     HPI: Aaron Simon is a 56 y.o. male    Past Medical History:  Diagnosis Date   Anal fissure    Anxiety    Back pain    Complication of anesthesia    Mental "goofy".  Sats dropped in PACU. "Does not take much"   Dysphagia    GERD (gastroesophageal reflux disease)    H. pylori infection    History of colon polyps    Hyperlipidemia    Internal hemorrhoid     Past Surgical History:  Procedure Laterality Date   APPENDECTOMY     COLONOSCOPY  11/07/2012   Diminutive colonic polyp status post polypectomy. Small internal hemorrhoids. Posterior annal fissure (the most likely eithology of bright red blood per rectum)   COLONOSCOPY     ESOPHAGOGASTRODUODENOSCOPY  09/10/2011   Schatzki ring status post esophageal dilatation. Small hiatal hernia.    LUMBAR FUSION  12/02/2014   LUMBAR LAMINECTOMY/DECOMPRESSION MICRODISCECTOMY  04/17/2012   Procedure: LUMBAR LAMINECTOMY/DECOMPRESSION MICRODISCECTOMY;  Surgeon: Sinclair Ship, MD;  Location: [facility];  Service: Orthopedics;  Laterality: Right;  Right sided lumbar 5-sacrum 1 microdisectomy   POLYPECTOMY      Prior to Admission medications   Medication Sig Start Date End Date Taking? Authorizing Provider  fluticasone (FLONASE) 50 MCG/ACT nasal spray Place into both nostrils. 12/01/21  Yes [provider]  atorvastatin (LIPITOR) 10 MG tablet Take 10 mg by mouth daily as needed. Patient not taking: Reported on 02/09/2022    [provider]  dexlansoprazole (DEXILANT) 60 MG capsule Take 1 capsule (60 mg total) by mouth daily. Patient not taking: Reported on 02/09/2022 06/04/19   Jackquline Denmark, MD  famotidine (PEPCID) 40 MG tablet Take 40 mg by mouth at bedtime.  Patient not taking: Reported on 02/28/2022    [provider]    Current  Outpatient Medications  Medication Sig Dispense Refill   fluticasone (FLONASE) 50 MCG/ACT nasal spray Place into both nostrils.     atorvastatin (LIPITOR) 10 MG tablet Take 10 mg by mouth daily as needed. (Patient not taking: Reported on 02/09/2022)     dexlansoprazole (DEXILANT) 60 MG capsule Take 1 capsule (60 mg total) by mouth daily. (Patient not taking: Reported on 02/09/2022) 30 capsule 11   famotidine (PEPCID) 40 MG tablet Take 40 mg by mouth at bedtime.  (Patient not taking: Reported on 02/28/2022)     Current Facility-Administered Medications  Medication Dose Route Frequency Provider Last Rate Last Admin   0.9 %  sodium chloride infusion  500 mL Intravenous Once Jackquline Denmark, MD        Allergies as of 02/28/2022   (No Known Allergies)    Family History  Problem Relation Age of Onset   Colon cancer Maternal Grandfather    Colon cancer Maternal Great-grandfather    Esophageal cancer Neg Hx    Colon polyps Neg Hx    Stomach cancer Neg Hx    Rectal cancer Neg Hx     Social History   Socioeconomic History   Marital status: Married    Spouse name: Not on file   Number of children: Not on file   Years of education: Not on file   Highest education level: Not on file  Occupational History   Occupation: Self employed  Tobacco  Use   Smoking status: Never   Smokeless tobacco: Never  Vaping Use   Vaping Use: Never used  Substance and Sexual Activity   Alcohol use: No   Drug use: No   Sexual activity: Not on file  Other Topics Concern   Not on file  Social History Narrative   Not on file   Social Determinants of Health   Financial Resource Strain: Not on file  Food Insecurity: Not on file  Transportation Needs: Not on file  Physical Activity: Not on file  Stress: Not on file  Social Connections: Not on file  Intimate Partner Violence: Not on file    Review of Systems: Positive for none All other review of systems negative except as mentioned in the  HPI.  Physical Exam: Vital signs in last 24 hours: '@VSRANGES'$ @   General:   Alert,  Well-developed, well-nourished, pleasant and cooperative in NAD Lungs:  Clear throughout to auscultation.   Heart:  Regular rate and rhythm; no murmurs, clicks, rubs,  or gallops. Abdomen:  Soft, nontender and nondistended. Normal bowel sounds.   Neuro/Psych:  Alert and cooperative. Normal mood and affect. A and O x 3    No significant changes were identified.  The patient continues to be an appropriate candidate for the planned procedure and anesthesia.   Carmell Austria, MD. [facility] 02/28/2022 4:37 PM@

## 2022-02-28 NOTE — Progress Notes (Signed)
PT taken to PACU. Monitors in place. VSS. Report given to RN. 

## 2022-02-28 NOTE — Patient Instructions (Signed)
   HANDOUTS ON POLYPS AND HEMORRHOIDS GIVEN TO YOU TODAY  AWAIT PATHOLOGY RESULTS ON POLYP REMOVED     YOU HAD AN ENDOSCOPIC PROCEDURE TODAY AT [facility]:   Refer to the procedure report that was given to you for any specific questions about what was found during the examination.  If the procedure report does not answer your questions, please call your gastroenterologist to clarify.  If you requested that your care partner not be given the details of your procedure findings, then the procedure report has been included in a sealed envelope for you to review at your convenience later.  YOU SHOULD EXPECT: Some feelings of bloating in the abdomen. Passage of more gas than usual.  Walking can help get rid of the air that was put into your GI tract during the procedure and reduce the bloating. If you had a lower endoscopy (such as a colonoscopy or flexible sigmoidoscopy) you may notice spotting of blood in your stool or on the toilet paper. If you underwent a bowel prep for your procedure, you may not have a normal bowel movement for a few days.  Please Note:  You might notice some irritation and congestion in your nose or some drainage.  This is from the oxygen used during your procedure.  There is no need for concern and it should clear up in a day or so.  SYMPTOMS TO REPORT IMMEDIATELY:  Following lower endoscopy (colonoscopy or flexible sigmoidoscopy):  Excessive amounts of blood in the stool  Significant tenderness or worsening of abdominal pains  Swelling of the abdomen that is new, acute  Fever of 100F or higher  For urgent or emergent issues, a gastroenterologist can be reached at any hour by calling [phone]. Do not use MyChart messaging for urgent concerns.    DIET:  We do recommend a small meal at first, but then you may proceed to your regular diet.  Drink plenty of fluids but you should avoid alcoholic beverages for 24 hours.  ACTIVITY:  You should plan to  take it easy for the rest of today and you should NOT DRIVE or use heavy machinery until tomorrow (because of the sedation medicines used during the test).    FOLLOW UP: Our staff will call the number listed on your records the next business day following your procedure.  We will call around 7:15- 8:00 am to check on you and address any questions or concerns that you may have regarding the information given to you following your procedure. If we do not reach you, we will leave a message.  If you develop any symptoms (ie: fever, flu-like symptoms, shortness of breath, cough etc.) before then, please call [phone].  If you test positive for Covid 19 in the 2 weeks post procedure, please call and report this information to [place].    If any biopsies were taken you will be contacted by phone or by letter within the next 1-3 weeks.  Please call us at [phone] if you have not heard about the biopsies in 3 weeks.    SIGNATURES/CONFIDENTIALITY: You and/or your care partner have signed paperwork which will be entered into your electronic medical record.  These signatures attest to the fact that that the information above on your After Visit Summary has been reviewed and is understood.  Full responsibility of the confidentiality of this discharge information lies with you and/or your care-partner.

## 2022-02-28 NOTE — Progress Notes (Signed)
Called to room to assist during endoscopic procedure.  Patient ID and intended procedure confirmed with present staff. Received instructions for my participation in the procedure from the performing physician.  

## 2022-02-28 NOTE — Op Note (Signed)
[place] Patient Name: Aaron Simon Procedure Date: 02/28/2022 4:07 PM MRN: [id number] Endoscopist: Jackquline Denmark , MD Age: 56 Referring MD:  Date of Birth: [birth year] Gender: Male Account #: [id number] Procedure:                Colonoscopy Indications:              High risk colon cancer surveillance: Personal                            history of colonic polyps Medicines:                Monitored Anesthesia Care Procedure:                Pre-Anesthesia Assessment:                           - Prior to the procedure, a History and Physical                            was performed, and patient medications and                            allergies were reviewed. The patient's tolerance of                            previous anesthesia was also reviewed. The risks                            and benefits of the procedure and the sedation                            options and risks were discussed with the patient.                            All questions were answered, and informed consent                            was obtained. Prior Anticoagulants: The patient has                            taken no previous anticoagulant or antiplatelet                            agents. ASA Grade Assessment: I - A normal, healthy                            patient. After reviewing the risks and benefits,                            the patient was deemed in satisfactory condition to                            undergo the procedure.  After obtaining informed consent, the colonoscope                            was passed under direct vision. Throughout the                            procedure, the patient's blood pressure, pulse, and                            oxygen saturations were monitored continuously. The                            Colonoscope was introduced through the anus and                            advanced to the 2 cm into the ileum. The                             colonoscopy was performed without difficulty. The                            patient tolerated the procedure well. The quality                            of the bowel preparation was good. The terminal                            ileum, ileocecal valve, appendiceal orifice, and                            rectum were photographed. Scope In: 4:20:02 PM Scope Out: 4:32:57 PM Scope Withdrawal Time: 0 hours 8 minutes 44 seconds  Total Procedure Duration: 0 hours 12 minutes 55 seconds  Findings:                 A 4 mm polyp was found in the mid descending colon.                            The polyp was sessile. The polyp was removed with a                            cold snare. Resection and retrieval were complete.                           Non-bleeding internal hemorrhoids were found during                            retroflexion. The hemorrhoids were small and Grade                            I (internal hemorrhoids that do not prolapse).                            Healed posterior anal  fissure.                           The terminal ileum appeared normal.                           The exam was otherwise without abnormality on                            direct and retroflexion views. Complications:            No immediate complications. Estimated Blood Loss:     Estimated blood loss: none. Impression:               - One 4 mm polyp in the mid descending colon,                            removed with a cold snare. Resected and retrieved.                           - Non-bleeding internal hemorrhoids.                           - The examined portion of the ileum was normal.                           - The examination was otherwise normal on direct                            and retroflexion views. Recommendation:           - Patient has a contact number available for                            emergencies. The signs and symptoms of potential                            delayed  complications were discussed with the                            patient. Return to normal activities tomorrow.                            Written discharge instructions were provided to the                            patient.                           - Resume previous diet.                           - Continue present medications.                           - Await pathology results.                           -  Repeat colonoscopy for surveillance based on                            pathology results.                           - The findings and recommendations were discussed                            with Seth Bake. Jackquline Denmark, MD 02/28/2022 4:39:00 PM This report has been signed electronically.

## 2022-03-01 ENCOUNTER — Telehealth: Payer: Self-pay

## 2022-03-01 NOTE — Telephone Encounter (Signed)
  Follow up Call-     02/28/2022    3:06 PM 06/18/2019    1:26 PM  Call back number  Post procedure Call Back phone  # [phone]  Permission to leave phone message Yes Yes     Patient questions:  Do you have a fever, pain , or abdominal swelling? No. Pain Score  0 *  Have you tolerated food without any problems? Yes.    Have you been able to return to your normal activities? Yes.    Do you have any questions about your discharge instructions: Diet   No. Medications  No. Follow up visit  No.  Do you have questions or concerns about your Care? No.  Actions: * If pain score is 4 or above: No action needed, pain <4.

## 2022-03-01 NOTE — Telephone Encounter (Signed)
  Follow up Call-     02/28/2022    3:06 PM 06/18/2019    1:26 PM  Call back number  Post procedure Call Back phone  # [phone]  Permission to leave phone message Yes Yes     Patient questions:  Do you have a fever, pain , or abdominal swelling? No. Pain Score  0 *  Have you tolerated food without any problems? Yes.    Have you been able to return to your normal activities? Yes.    Do you have any questions about your discharge instructions: Diet   No. Medications  No. Follow up visit  No.  Do you have questions or concerns about your Care? No.  Actions: * If pain score is 4 or above: No action needed, pain <4.

## 2022-03-14 ENCOUNTER — Encounter: Payer: Self-pay | Admitting: Gastroenterology

## 2022-03-14 ENCOUNTER — Telehealth: Payer: Self-pay | Admitting: Gastroenterology

## 2022-03-14 NOTE — Telephone Encounter (Signed)
Bx-tubular adenoma Repeat colonoscopy in 7 years.  Earlier, if with any new problems Have sent a letter.  He will be getting it shortly RG

## 2022-03-14 NOTE — Telephone Encounter (Signed)
Pt stated that he received his pathology through his My Chart: Pt is questioning when Dr. Lyndel Safe wants pt to follow up with another colonoscopy. Please advise

## 2022-03-15 NOTE — Telephone Encounter (Signed)
Colonoscopy recall placed in Epic for 7 years: Pt made aware:  Pt made aware of letter that was created by Dr. Lyndel Safe. Letter read aloud to pt:  Pt stated that before recent procedure Dr. Lyndel Safe asked pt how has his swallowing has been recently:Pt stated that he told Dr Lyndel Safe that it has been fine but the more he thought about it he realized that it he has been having difficulty: Pt was scheduled for an office visit with Dr. Lyndel Safe on 04/25/2022 at 10:40 AM: Pt made aware Pt verbalized understanding with all questions answered.

## 2022-04-25 ENCOUNTER — Ambulatory Visit: Payer: Commercial Managed Care - PPO | Admitting: Gastroenterology

## 2024-01-09 ENCOUNTER — Other Ambulatory Visit: Payer: Self-pay | Admitting: Medical Genetics

## 2024-01-10 ENCOUNTER — Other Ambulatory Visit (HOSPITAL_COMMUNITY)
Admission: RE | Admit: 2024-01-10 | Discharge: 2024-01-10 | Disposition: A | Discharge status: Home / Self Care | Payer: Self-pay | Source: Ambulatory Visit | Attending: Medical Genetics | Admitting: Medical Genetics

## 2024-01-21 LAB — GENECONNECT MOLECULAR SCREEN: Genetic Analysis Overall Interpretation: NEGATIVE
